# Patient Record
Sex: Male | Born: 1970 | Race: White | Hispanic: No | Marital: Married | Smoking: Never smoker
Health system: Southern US, Community
[De-identification: ages and names within clinical notes are randomized; demographics above are authoritative.]

## PROBLEM LIST (undated history)

## (undated) DIAGNOSIS — R112 Nausea with vomiting, unspecified: Secondary | ICD-10-CM

## (undated) DIAGNOSIS — Z973 Presence of spectacles and contact lenses: Secondary | ICD-10-CM

## (undated) DIAGNOSIS — R059 Cough, unspecified: Secondary | ICD-10-CM

## (undated) DIAGNOSIS — N200 Calculus of kidney: Secondary | ICD-10-CM

## (undated) DIAGNOSIS — M545 Low back pain, unspecified: Secondary | ICD-10-CM

## (undated) DIAGNOSIS — J069 Acute upper respiratory infection, unspecified: Secondary | ICD-10-CM

## (undated) DIAGNOSIS — Z9889 Other specified postprocedural states: Secondary | ICD-10-CM

## (undated) DIAGNOSIS — R05 Cough: Secondary | ICD-10-CM

## (undated) HISTORY — DX: Cough: R05

## (undated) HISTORY — PX: RETINAL DETACHMENT SURGERY: SHX105

## (undated) HISTORY — DX: Acute upper respiratory infection, unspecified: J06.9

## (undated) HISTORY — DX: Calculus of kidney: N20.0

## (undated) HISTORY — PX: NASAL SINUS SURGERY: SHX719

## (undated) HISTORY — DX: Low back pain: M54.5

## (undated) HISTORY — DX: Low back pain, unspecified: M54.50

## (undated) HISTORY — DX: Cough, unspecified: R05.9

## (undated) HISTORY — PX: SHOULDER SURGERY: SHX246

## (undated) HISTORY — PX: FACIAL RECONSTRUCTION SURGERY: SHX631

---

## 2002-11-27 ENCOUNTER — Ambulatory Visit (HOSPITAL_COMMUNITY): Admission: RE | Admit: 2002-11-27 | Discharge: 2002-11-27 | Payer: Self-pay | Admitting: Orthopedic Surgery

## 2002-11-27 ENCOUNTER — Encounter: Payer: Self-pay | Admitting: Orthopedic Surgery

## 2010-10-13 ENCOUNTER — Encounter: Payer: Self-pay | Admitting: Critical Care Medicine

## 2010-10-13 ENCOUNTER — Ambulatory Visit (INDEPENDENT_AMBULATORY_CARE_PROVIDER_SITE_OTHER)
Admission: RE | Admit: 2010-10-13 | Discharge: 2010-10-13 | Disposition: A | Discharge status: Home / Self Care | Payer: PRIVATE HEALTH INSURANCE | Source: Ambulatory Visit | Attending: Critical Care Medicine | Admitting: Critical Care Medicine

## 2010-10-13 ENCOUNTER — Ambulatory Visit (INDEPENDENT_AMBULATORY_CARE_PROVIDER_SITE_OTHER): Payer: PRIVATE HEALTH INSURANCE | Admitting: Critical Care Medicine

## 2010-10-13 DIAGNOSIS — R05 Cough: Secondary | ICD-10-CM

## 2010-10-13 DIAGNOSIS — J309 Allergic rhinitis, unspecified: Secondary | ICD-10-CM

## 2010-10-13 DIAGNOSIS — R059 Cough, unspecified: Secondary | ICD-10-CM | POA: Insufficient documentation

## 2010-10-13 MED ORDER — BENZONATATE 100 MG PO CAPS: ORAL_CAPSULE | ORAL | Status: AC

## 2010-10-13 MED ORDER — PREDNISONE 10 MG PO TABS: ORAL_TABLET | ORAL | Status: DC

## 2010-10-13 MED ORDER — CHLORPHENIRAMINE MALEATE CR 8 MG PO CPCR: 8.0000 mg | ORAL_CAPSULE | Freq: Two times a day (BID) | ORAL | Status: AC

## 2010-10-13 MED ORDER — HYDROCOD POLST-CPM POLST ER 10-8 MG PO CP12: 1.0000 | ORAL_CAPSULE | Freq: Two times a day (BID) | ORAL | Status: DC | PRN

## 2010-10-13 MED ORDER — BECLOMETHASONE DIPROPIONATE 80 MCG/ACT NA AERS: 2.0000 | INHALATION_SPRAY | Freq: Every day | NASAL | Status: DC

## 2010-10-13 NOTE — Patient Instructions (Addendum)
Cyclic cough protocol Prednisone for 8 days Qnasl/chlorpheniramine  Chest xray today Reflux diet Return 3 weeks for recheck

## 2010-10-13 NOTE — Progress Notes (Signed)
Subjective:    Patient ID: Troy Solomon, male    DOB: 06/11/1970, 39 y.o.   MRN: [id number]  Cough This is a new problem. The current episode started more than 1 month ago. The problem has been unchanged. The cough is non-productive (sensation of choking, throat not sore,). Associated symptoms include chest pain, headaches, shortness of breath and wheezing. Pertinent negatives include no chills, ear pain, fever, heartburn, hemoptysis, myalgias, nasal congestion, postnasal drip, rash, rhinorrhea or sore throat. The symptoms are aggravated by exercise and lying down (humidity). 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 He has tried a beta-agonist inhaler and steroid inhaler for the symptoms. The treatment provided no relief. His past medical history is significant for bronchitis. There is no history of asthma, COPD, emphysema, environmental allergies or pneumonia.   40 y.o.WM dry cough for two months   Past Medical History  Diagnosis Date  . Cough   . Gout   . Lumbago   . URI (upper respiratory infection)      Family History  Problem Relation Age of Onset  . Dementia Maternal Grandfather   . Gout Father   . Heart failure Paternal Grandfather   . Heart disease Mother   . Allergies Mother   . Colon cancer Maternal Grandfather      History   Social History  . Marital Status: Married    Spouse Name: N/A    Number of Children: 3  . Years of Education: N/A   Occupational History  . Manager of [employer]    Social History Main Topics  . Smoking status: Never Smoker   . Smokeless tobacco: Never Used  . Alcohol Use: No  . Drug Use: No  . Sexually Active: Not on file   Other Topics Concern  . Not on file   Social History Narrative  . No narrative on file     No Known Allergies   Outpatient Prescriptions Prior to Visit  Medication Sig Dispense Refill  . allopurinol (ZYLOPRIM) 100 MG tablet Take 100 mg by mouth daily.        . budesonide-formoterol (SYMBICORT) 80-4.5 MCG/ACT inhaler Inhale 2 puffs into the lungs 2 (two) times daily.           Review of Systems  Constitutional: Negative for fever, chills, diaphoresis, activity change, appetite change, fatigue and unexpected weight change.  HENT: Positive for congestion and sneezing. Negative for hearing loss, ear pain, nosebleeds, sore throat, facial swelling, rhinorrhea, mouth sores, trouble swallowing, neck pain, neck stiffness, dental problem, voice change, postnasal drip, sinus pressure, tinnitus and ear discharge.   Eyes: Negative for photophobia, discharge, itching and visual disturbance.  Respiratory: Positive for cough, choking, shortness of breath and wheezing. Negative for apnea, hemoptysis, chest tightness and stridor.   Cardiovascular: Positive for chest pain. Negative for palpitations and leg swelling.  Gastrointestinal: Negative for heartburn, nausea, vomiting, abdominal pain, constipation, blood in stool and abdominal distention.  Genitourinary: Negative for dysuria, urgency, frequency, hematuria, flank pain, decreased urine volume and difficulty urinating.  Musculoskeletal: Positive for back pain. Negative for myalgias, joint swelling, arthralgias and gait problem.  Skin: Negative for color change, pallor and rash.  Neurological: Positive for light-headedness and headaches. Negative for dizziness, tremors, seizures, syncope, speech difficulty, weakness and numbness.  Hematological: Negative for environmental allergies and adenopathy. Does not bruise/bleed easily.  Psychiatric/Behavioral: Negative for confusion, sleep disturbance and agitation. The patient is not nervous/anxious.        Objective:  Physical Exam  Filed Vitals:   10/13/10 1522  BP: 124/80  Pulse: 95  Temp: 98.3 F (36.8 C)  TempSrc: Oral  Height: 5\' 10"  (1.778 m)  Weight: 234 lb 12.8 oz (106.505 kg)  SpO2: 98%    Gen: Pleasant, well-nourished, in no distress,  normal  affect  ENT:   mouth clear,  oropharynx clear, +  postnasal drip. Moderate turbinate edema, mild purulence R nares  Neck: No JVD, no TMG, no carotid bruits  Lungs: No use of accessory muscles, no dullness to percussion, clear without rales or rhonchi  Cardiovascular: RRR, heart sounds normal, no murmur or gallops, no peripheral edema  Abdomen: soft and NT, no HSM,  BS normal  Musculoskeletal: No deformities, no cyanosis or clubbing  Neuro: alert, non focal  Skin: Warm, no lesions or rashes  10/13/10 spiro: Normal  8/22 CXR NAD     Assessment & Plan:   Cough Cyclical cough d/t postnasal drip syndrome d/t allergic rhinitis, mild sinusitis No primary lung dz Normal Spirometry 8/12 Normal CXR 8/12  Plan Cyclic cough protocol Prednisone for 8 days Qnasl/chlorpheniramine  Reflux diet Return 3 weeks for recheck      Updated Medication List Outpatient Encounter Prescriptions as of 10/13/2010  Medication Sig Dispense Refill  . allopurinol (ZYLOPRIM) 300 MG tablet Take 300 mg by mouth daily.        . indomethacin (INDOCIN SR) 75 MG CR capsule Take 75 mg by mouth as needed.        Marland Kitchen DISCONTD: allopurinol (ZYLOPRIM) 100 MG tablet Take 100 mg by mouth daily.       . Beclomethasone Dipropionate (QNASL) 80 MCG/ACT AERS Place 2 puffs into the nose daily.  1 Inhaler  6  . benzonatate (TESSALON) 100 MG capsule Take per cough protocol  90 capsule  4  . chlorpheniramine (CHLOR-TRIMETON) 8 MG capsule Take 1 capsule (8 mg total) by mouth 2 (two) times daily.  60 capsule  6  . Hydrocod Polst-Chlorphen Polst (TUSSICAPS) 10-8 MG CP12 Take 1 capsule by mouth 2 (two) times daily as needed.  6 each  0  . predniSONE (DELTASONE) 10 MG tablet Take 4 for two days, three for two days, two for two days, then one for two days and stop   20 tablet  0  . DISCONTD: budesonide-formoterol (SYMBICORT) 80-4.5 MCG/ACT inhaler Inhale 2 puffs into the lungs 2 (two) times daily.

## 2010-10-14 NOTE — Assessment & Plan Note (Addendum)
Cyclical cough d/t postnasal drip syndrome d/t allergic rhinitis, mild sinusitis No primary lung dz Normal Spirometry 8/12 Normal CXR 8/12  Plan Cyclic cough protocol Prednisone for 8 days Qnasl/chlorpheniramine  Reflux diet Return 3 weeks for recheck

## 2010-10-18 ENCOUNTER — Telehealth: Payer: Self-pay | Admitting: Critical Care Medicine

## 2010-10-18 NOTE — Telephone Encounter (Signed)
Call pt and tell him CXR was normal Advised wife xray was normal and nothing further was needed

## 2010-11-05 ENCOUNTER — Ambulatory Visit: Payer: PRIVATE HEALTH INSURANCE | Admitting: Critical Care Medicine

## 2010-11-12 ENCOUNTER — Ambulatory Visit: Payer: PRIVATE HEALTH INSURANCE | Admitting: Critical Care Medicine

## 2014-05-07 ENCOUNTER — Ambulatory Visit: Payer: Self-pay | Admitting: Physician Assistant

## 2014-07-15 ENCOUNTER — Ambulatory Visit
Admission: EM | Admit: 2014-07-15 | Discharge: 2014-07-15 | Disposition: A | Discharge status: Home / Self Care | Payer: 59 | Attending: Family Medicine | Admitting: Family Medicine

## 2014-07-15 DIAGNOSIS — M109 Gout, unspecified: Secondary | ICD-10-CM

## 2014-07-15 DIAGNOSIS — M1000 Idiopathic gout, unspecified site: Secondary | ICD-10-CM | POA: Diagnosis not present

## 2014-07-15 MED ORDER — INDOMETHACIN 50 MG PO CAPS: 50.0000 mg | ORAL_CAPSULE | Freq: Three times a day (TID) | ORAL | Status: DC | PRN

## 2014-07-15 NOTE — ED Provider Notes (Signed)
CSN: [id number]     Arrival date & time 07/15/14  1318 History   First MD Initiated Contact with Patient 07/15/14 1437     Chief Complaint  Patient presents with  . Gout   (Consider location/radiation/quality/duration/timing/severity/associated sxs/prior Treatment) HPI      44 year old male presents complaining of a gout flareup. He has pain, redness, and swelling in his right first MTP since yesterday. He has had gout many times in this exact spot. Denies any injury. Denies any systemic symptoms. This has been treated in the past with indomethacin, he is requesting a new prescription for indomethacin  Past Medical History  Diagnosis Date  . Cough   . Gout   . Lumbago   . URI (upper respiratory infection)    Past Surgical History  Procedure Laterality Date  . Facial reconstruction surgery    . Shoulder surgery     Family History  Problem Relation Age of Onset  . Dementia Maternal Grandfather   . Gout Father   . Heart failure Paternal Grandfather   . Heart disease Mother   . Allergies Mother   . Colon cancer Maternal Grandfather    History  Substance Use Topics  . Smoking status: Never Smoker   . Smokeless tobacco: Never Used  . Alcohol Use: No    Review of Systems  Musculoskeletal: Positive for joint swelling and arthralgias.  All other systems reviewed and are negative.   Allergies  Review of patient's allergies indicates no known allergies.  Home Medications   Prior to Admission medications   Medication Sig Start Date End Date Taking? Authorizing Provider  allopurinol (ZYLOPRIM) 300 MG tablet Take 300 mg by mouth daily.      Historical Provider, MD  Beclomethasone Dipropionate (QNASL) 80 MCG/ACT AERS Place 2 puffs into the nose daily. 10/13/10   Elsie Stain, MD  Hydrocod Polst-Chlorphen Polst (TUSSICAPS) 10-8 MG CP12 Take 1 capsule by mouth 2 (two) times daily as needed. 10/13/10   Elsie Stain, MD  indomethacin (INDOCIN SR) 75 MG CR capsule Take 75 mg by  mouth as needed.      Historical Provider, MD  indomethacin (INDOCIN) 50 MG capsule Take 1 capsule (50 mg total) by mouth 3 (three) times daily as needed for mild pain. 07/15/14   Liam Graham, PA-C  predniSONE (DELTASONE) 10 MG tablet Take 4 for two days, three for two days, two for two days, then one for two days and stop  10/13/10   Elsie Stain, MD   BP 140/84 mmHg  Pulse 71  Temp(Src) 98 F (36.7 C) (Oral)  Resp 16  Ht 5\' 10"  (1.778 m)  Wt 208 lb (94.348 kg)  BMI 29.84 kg/m2  SpO2 100% Physical Exam  Constitutional: He is oriented to person, place, and time. He appears well-developed and well-nourished. No distress.  HENT:  Head: Normocephalic.  Pulmonary/Chest: Effort normal. No respiratory distress.  Musculoskeletal:       Right foot: There is decreased range of motion, tenderness and swelling. There is no deformity.  Pain redness swelling and decreased RM of the right first MTP  Neurological: He is alert and oriented to person, place, and time. Coordination normal.  Skin: Skin is warm and dry. No rash noted. He is not diaphoretic.  Psychiatric: He has a normal mood and affect. Judgment normal.  Nursing note and vitals reviewed.   ED Course  Procedures (including critical care time) Labs Review Labs Reviewed - No data to display  Imaging Review No results found.   MDM   1. Podagra    Consistent with gout. Treat with indomethacin. I have encouraged him follow-up with his primary care provider in a month to consider starting on allopurinol.    Liam Graham, PA-C 07/15/14 1447

## 2014-07-15 NOTE — ED Notes (Signed)
Right great toe pain and swelling X 1 day. Has a history of gout. Indomethacin usually resolves his issue, but he did not have any tablets left.

## 2014-07-15 NOTE — Discharge Instructions (Signed)
Gout Gout is an inflammatory arthritis caused by a buildup of uric acid crystals in the joints. Uric acid is a chemical that is normally present in the blood. When the level of uric acid in the blood is too high it can form crystals that deposit in your joints and tissues. This causes joint redness, soreness, and swelling (inflammation). Repeat attacks are common. Over time, uric acid crystals can form into masses (tophi) near a joint, destroying bone and causing disfigurement. Gout is treatable and often preventable. CAUSES  The disease begins with elevated levels of uric acid in the blood. Uric acid is produced by your body when it breaks down a naturally found substance called purines. Certain foods you eat, such as meats and fish, contain high amounts of purines. Causes of an elevated uric acid level include:  Being passed down from parent to child (heredity).  Diseases that cause increased uric acid production (such as obesity, psoriasis, and certain cancers).  Excessive alcohol use.  Diet, especially diets rich in meat and seafood.  Medicines, including certain cancer-fighting medicines (chemotherapy), water pills (diuretics), and aspirin.  Chronic kidney disease. The kidneys are no longer able to remove uric acid well.  Problems with metabolism. Conditions strongly associated with gout include:  Obesity.  High blood pressure.  High cholesterol.  Diabetes. Not everyone with elevated uric acid levels gets gout. It is not understood why some people get gout and others do not. Surgery, joint injury, and eating too much of certain foods are some of the factors that can lead to gout attacks. SYMPTOMS   An attack of gout comes on quickly. It causes intense pain with redness, swelling, and warmth in a joint.  Fever can occur.  Often, only one joint is involved. Certain joints are more commonly involved:  Base of the big toe.  Knee.  Ankle.  Wrist.  Finger. Without  treatment, an attack usually goes away in a few days to weeks. Between attacks, you usually will not have symptoms, which is different from many other forms of arthritis. DIAGNOSIS  Your caregiver will suspect gout based on your symptoms and exam. In some cases, tests may be recommended. The tests may include:  Blood tests.  Urine tests.  X-rays.  Joint fluid exam. This exam requires a needle to remove fluid from the joint (arthrocentesis). Using a microscope, gout is confirmed when uric acid crystals are seen in the joint fluid. TREATMENT  There are two phases to gout treatment: treating the sudden onset (acute) attack and preventing attacks (prophylaxis).  Treatment of an Acute Attack.  Medicines are used. These include anti-inflammatory medicines or steroid medicines.  An injection of steroid medicine into the affected joint is sometimes necessary.  The painful joint is rested. Movement can worsen the arthritis.  You may use warm or cold treatments on painful joints, depending which works best for you.  Treatment to Prevent Attacks.  If you suffer from frequent gout attacks, your caregiver may advise preventive medicine. These medicines are started after the acute attack subsides. These medicines either help your kidneys eliminate uric acid from your body or decrease your uric acid production. You may need to stay on these medicines for a very long time.  The early phase of treatment with preventive medicine can be associated with an increase in acute gout attacks. For this reason, during the first few months of treatment, your caregiver may also advise you to take medicines usually used for acute gout treatment. Be sure you   understand your caregiver's directions. Your caregiver may make several adjustments to your medicine dose before these medicines are effective.  Discuss dietary treatment with your caregiver or dietitian. Alcohol and drinks high in sugar and fructose and foods  such as meat, poultry, and seafood can increase uric acid levels. Your caregiver or dietitian can advise you on drinks and foods that should be limited. HOME CARE INSTRUCTIONS   Do not take aspirin to relieve pain. This raises uric acid levels.  Only take over-the-counter or prescription medicines for pain, discomfort, or fever as directed by your caregiver.  Rest the joint as much as possible. When in bed, keep sheets and blankets off painful areas.  Keep the affected joint raised (elevated).  Apply warm or cold treatments to painful joints. Use of warm or cold treatments depends on which works best for you.  Use crutches if the painful joint is in your leg.  Drink enough fluids to keep your urine clear or pale yellow. This helps your body get rid of uric acid. Limit alcohol, sugary drinks, and fructose drinks.  Follow your dietary instructions. Pay careful attention to the amount of protein you eat. Your daily diet should emphasize fruits, vegetables, whole grains, and fat-free or low-fat milk products. Discuss the use of coffee, vitamin C, and cherries with your caregiver or dietitian. These may be helpful in lowering uric acid levels.  Maintain a healthy body weight. SEEK MEDICAL CARE IF:   You develop diarrhea, vomiting, or any side effects from medicines.  You do not feel better in 24 hours, or you are getting worse. SEEK IMMEDIATE MEDICAL CARE IF:   Your joint becomes suddenly more tender, and you have chills or a fever. MAKE SURE YOU:   Understand these instructions.  Will watch your condition.  Will get help right away if you are not doing well or get worse. Document Released: 02/05/2000 Document Revised: 06/24/2013 Document Reviewed: 09/21/2011 [employer] Patient Information 2015 [employer]. This information is not intended to replace advice given to you by your health care provider. Make sure you discuss any questions you have with your health care  provider. Low-Purine Diet Purines are compounds that affect the level of uric acid in your body. A low-purine diet is a diet that is low in purines. Eating a low-purine diet can prevent the level of uric acid in your body from getting too high and causing gout or kidney stones or both. WHAT DO I NEED TO KNOW ABOUT THIS DIET?  Choose low-purine foods. Examples of low-purine foods are listed in the next section.  Drink plenty of fluids, especially water. Fluids can help remove uric acid from your body. Try to drink 8-16 cups (1.9-3.8 L) a day.  Limit foods high in fat, especially saturated fat, as fat makes it harder for the body to get rid of uric acid. Foods high in saturated fat include pizza, cheese, ice cream, whole milk, fried foods, and gravies. Choose foods that are lower in fat and lean sources of protein. Use olive oil when cooking as it contains healthy fats that are not high in saturated fat.  Limit alcohol. Alcohol interferes with the elimination of uric acid from your body. If you are having a gout attack, avoid all alcohol.  Keep in mind that different people's bodies react differently to different foods. You will probably learn over time which foods do or do not affect you. If you discover that a food tends to cause your gout to flare up,   avoid eating that food. You can more freely enjoy foods that do not cause problems. If you have any questions about a food item, talk to your dietitian or health care provider. WHICH FOODS ARE LOW, MODERATE, AND HIGH IN PURINES? The following is a list of foods that are low, moderate, and high in purines. You can eat any amount of the foods that are low in purines. You may be able to have small amounts of foods that are moderate in purines. Ask your health care provider how much of a food moderate in purines you can have. Avoid foods high in purines. Grains  Foods low in purines: Enriched white bread, pasta, rice, cake, cornbread, popcorn.  Foods  moderate in purines: Whole-grain breads and cereals, wheat germ, bran, oatmeal. Uncooked oatmeal. Dry wheat bran or wheat germ.  Foods high in purines: Pancakes, French toast, biscuits, muffins. Vegetables  Foods low in purines: All vegetables, except those that are moderate in purines.  Foods moderate in purines: Asparagus, cauliflower, spinach, mushrooms, green peas. Fruits  All fruits are low in purines. Meats and other Protein Foods  Foods low in purines: Eggs, nuts, peanut butter.  Foods moderate in purines: 80-90% lean beef, lamb, veal, pork, poultry, fish, eggs, peanut butter, nuts. Crab, lobster, oysters, and shrimp. Cooked dried beans, peas, and lentils.  Foods high in purines: Anchovies, sardines, herring, mussels, tuna, codfish, scallops, trout, and haddock. Bacon. Organ meats (such as liver or kidney). Tripe. Game meat. Goose. Sweetbreads. Dairy  All dairy foods are low in purines. Low-fat and fat-free dairy products are best because they are low in saturated fat. Beverages  Drinks low in purines: Water, carbonated beverages, tea, coffee, cocoa.  Drinks moderate in purines: Soft drinks and other drinks sweetened with high-fructose corn syrup. Juices. To find whether a food or drink is sweetened with high-fructose corn syrup, look at the ingredients list.  Drinks high in purines: Alcoholic beverages (such as beer). Condiments  Foods low in purines: Salt, herbs, olives, pickles, relishes, vinegar.  Foods moderate in purines: Butter, margarine, oils, mayonnaise. Fats and Oils  Foods low in purines: All types, except gravies and sauces made with meat.  Foods high in purines: Gravies and sauces made with meat. Other Foods  Foods low in purines: Sugars, sweets, gelatin. Cake. Soups made without meat.  Foods moderate in purines: Meat-based or fish-based soups, broths, or bouillons. Foods and drinks sweetened with high-fructose corn syrup.  Foods high in purines:  High-fat desserts (such as ice cream, cookies, cakes, pies, doughnuts, and chocolate). Contact your dietitian for more information on foods that are not listed here. Document Released: 06/04/2010 Document Revised: 02/12/2013 Document Reviewed: 01/14/2013 [employer] Patient Information 2015 [employer]. This information is not intended to replace advice given to you by your health care provider. Make sure you discuss any questions you have with your health care provider.  

## 2014-09-07 ENCOUNTER — Ambulatory Visit
Admission: EM | Admit: 2014-09-07 | Discharge: 2014-09-07 | Disposition: A | Discharge status: Home / Self Care | Payer: 59 | Attending: Physician Assistant | Admitting: Physician Assistant

## 2014-09-07 ENCOUNTER — Encounter: Payer: Self-pay | Admitting: Physician Assistant

## 2014-09-07 DIAGNOSIS — M545 Low back pain, unspecified: Secondary | ICD-10-CM

## 2014-09-07 MED ORDER — IBUPROFEN 600 MG PO TABS: 600.0000 mg | ORAL_TABLET | Freq: Four times a day (QID) | ORAL | Status: DC | PRN

## 2014-09-07 NOTE — ED Notes (Signed)
Moving patio stones yesterday, pain in center of lower back started yesterday, progressively worsened today.  Difficulty standing once sitting for awhile.  No change in bowel or bladder.

## 2014-09-07 NOTE — Discharge Instructions (Signed)
Ice paks/anti-inflammatory Rx as discussed   Ibuprofen 600mg  plus 200 mg = 800mg     three tiimes daily with meals x 3-5 days Step down dosage  as tolerated 600     400      Use heat and ice paks as comfortable as time progresses Back exercises  Body mechanics.. Ears-shoulders-hips-ankles !     Back Pain, Adult Low back pain is very common. About 1 in 5 people have back pain.The cause of low back pain is rarely dangerous. The pain often gets better over time.About half of people with a sudden onset of back pain feel better in just 2 weeks. About 8 in 10 people feel better by 6 weeks.  CAUSES Some common causes of back pain include:  Strain of the muscles or ligaments supporting the spine.  Wear and tear (degeneration) of the spinal discs.  Arthritis.  Direct injury to the back. DIAGNOSIS Most of the time, the direct cause of low back pain is not known.However, back pain can be treated effectively even when the exact cause of the pain is unknown.Answering your caregiver's questions about your overall health and symptoms is one of the most accurate ways to make sure the cause of your pain is not dangerous. If your caregiver needs more information, he or she may order lab work or imaging tests (X-rays or MRIs).However, even if imaging tests show changes in your back, this usually does not require surgery. HOME CARE INSTRUCTIONS For many people, back pain returns.Since low back pain is rarely dangerous, it is often a condition that people can learn to [facility] their own.   Remain active. It is stressful on the back to sit or stand in one place. Do not sit, drive, or stand in one place for more than 30 minutes at a time. Take short walks on level surfaces as soon as pain allows.Try to increase the length of time you walk each day.  Do not stay in bed.Resting more than 1 or 2 days can delay your recovery.  Do not avoid exercise or work.Your body is made to move.It is not  dangerous to be active, even though your back may hurt.Your back will likely heal faster if you return to being active before your pain is gone.  Pay attention to your body when you bend and lift. Many people have less discomfortwhen lifting if they bend their knees, keep the load close to their bodies,and avoid twisting. Often, the most comfortable positions are those that put less stress on your recovering back.  Find a comfortable position to sleep. Use a firm mattress and lie on your side with your knees slightly bent. If you lie on your back, put a pillow under your knees.  Only take over-the-counter or prescription medicines as directed by your caregiver. Over-the-counter medicines to reduce pain and inflammation are often the most helpful.Your caregiver may prescribe muscle relaxant drugs.These medicines help dull your pain so you can more quickly return to your normal activities and healthy exercise.  Put ice on the injured area.  Put ice in a plastic bag.  Place a towel between your skin and the bag.  Leave the ice on for 15-20 minutes, 03-04 times a day for the first 2 to 3 days. After that, ice and heat may be alternated to reduce pain and spasms.  Ask your caregiver about trying back exercises and gentle massage. This may be of some benefit.  Avoid feeling anxious or stressed.Stress increases muscle tension and can  worsen back pain.It is important to recognize when you are anxious or stressed and learn ways to manage it.Exercise is a great option. SEEK MEDICAL CARE IF:  You have pain that is not relieved with rest or medicine.  You have pain that does not improve in 1 week.  You have new symptoms.  You are generally not feeling well. SEEK IMMEDIATE MEDICAL CARE IF:   You have pain that radiates from your back into your legs.  You develop new bowel or bladder control problems.  You have unusual weakness or numbness in your arms or legs.  You develop nausea or  vomiting.  You develop abdominal pain.  You feel faint. Document Released: 02/07/2005 Document Revised: 08/09/2011 Document Reviewed: 06/11/2013 [facility] Patient Information 2015 [place], [place]. This information is not intended to replace advice given to you by your health care provider. Make sure you discuss any questions you have with your health care provider.

## 2014-09-07 NOTE — ED Provider Notes (Signed)
CSN: [id number]     Arrival date & time 09/07/14  1530 History   None    Chief Complaint  Patient presents with  . Back Pain   (Consider location/radiation/quality/duration/timing/severity/associated sxs/prior Treatment) HPI 44 yo M moved [employer] for house party yesterday.Low back discomfort today. Took ibuprofen 800 mg x 1 yesterday and x 1 today with some relief.Feels uncomfortable when changing position from sit/stand/sit.  No numbness or tingling, no difficulty voiding or stopping stream. Has been in [place] shopping at [employer] all afternoon. Drove family over, walked the warehouse Wearing flip flops. Works with [employer], sometimes meetings, sometimes driving trucks  Past Medical History  Diagnosis Date  . Cough   . Gout   . Lumbago   . URI (upper respiratory infection)    Past Surgical History  Procedure Laterality Date  . Facial reconstruction surgery    . Shoulder surgery     Family History  Problem Relation Age of Onset  . Dementia Maternal Grandfather   . Gout Father   . Heart failure Paternal Grandfather   . Heart disease Mother   . Allergies Mother   . Colon cancer Maternal Grandfather    History  Substance Use Topics  . Smoking status: Never Smoker   . Smokeless tobacco: Never Used  . Alcohol Use: No    Review of Systems Review of 10 systems negative for acute change except as referenced in HPI Allergies  Review of patient's allergies indicates no known allergies.  Home Medications   Prior to Admission medications   Medication Sig Start Date End Date Taking? Authorizing Provider  allopurinol (ZYLOPRIM) 300 MG tablet Take 300 mg by mouth daily.      Historical Provider, MD  Beclomethasone Dipropionate (QNASL) 80 MCG/ACT AERS Place 2 puffs into the nose daily. 10/13/10   Elsie Stain, MD  Hydrocod Polst-Chlorphen Polst (TUSSICAPS) 10-8 MG CP12 Take 1 capsule by mouth 2 (two) times daily as needed. 10/13/10    Elsie Stain, MD  ibuprofen (ADVIL,MOTRIN) 600 MG tablet Take 1 tablet (600 mg total) by mouth every 6 (six) hours as needed for moderate pain. 09/07/14   Jan Fireman, PA-C  indomethacin (INDOCIN SR) 75 MG CR capsule Take 75 mg by mouth as needed.      Historical Provider, MD  indomethacin (INDOCIN) 50 MG capsule Take 1 capsule (50 mg total) by mouth 3 (three) times daily as needed for mild pain. 07/15/14   Liam Graham, PA-C  predniSONE (DELTASONE) 10 MG tablet Take 4 for two days, three for two days, two for two days, then one for two days and stop  10/13/10   Elsie Stain, MD  Denies use of any of the 2012 medications at this time  BP 132/70 mmHg  Pulse 66  Temp(Src) 97.8 F (36.6 C) (Oral)  Resp 20  Ht 5\' 10"  (1.778 m)  Wt 189 lb (85.73 kg)  BMI 27.12 kg/m2  SpO2 100% Physical Exam   Constitutional -alert and oriented,well appearing and appears in no acute distress, reporting low back pain since over-activity yesterday Head-atraumatic, normocephalic Eyes- conjunctiva normal, EOMI ,conjugate gaze Nose- no congestion or rhinorrhea Mouth/throat- mucous membranes moist , Neck- supple without glandular enlargement CV- regular rate, grossly normal heart sounds,  Resp-no distress, normal respiratory effort,clear to auscultation bilaterally Back - no CVAT , no paraspinous muscle tenderness, spinal column non-tender,  GI- no distention GU-  not examined MSK- ambulatory without assistance. Can climb  on and off table without assistance, toe walk , heel walk , toe touch, pinwheel, squat and return without assistance or expression of pain. Supine for SLR is neg bilaterally; DTRs equal bilat WNL; sensation normal bilat LEs; denies issues with void/defecate Neuro- normal speech and language, no gross focal neurological deficit appreciated, no gait instability, Skin-warm,dry ,intact; no rash noted Psych-mood and affect grossly normal; speech and behavior grossly normal ED Course   Procedures (including critical care time) Labs Review Labs Reviewed - No data to display  Imaging Review No results found.   MDM   1. Right-sided low back pain without sciatica   Plan: 1.  diagnosis reviewed with patient-low back strain 2. Rx as per orders; risks, benefits, potential side effects reviewed with patient- hx of GERD with NSAIDs, needs to be aware and step away if GI upset develops -go to tylenol 3. Recommend supportive treatment with ice packs/heat pad. His symptoms appear to be quite mild Encourage athletic rubs, given exercises,body mechanics reviewed, report back with exacerbation. 4. F/u prn if symptoms worsen or don't improve   Does not want time off tomorrow  Jan Fireman, PA-C 09/07/14 1700

## 2014-09-09 ENCOUNTER — Telehealth: Payer: Self-pay | Admitting: Physician Assistant

## 2014-09-09 NOTE — ED Notes (Signed)
Will notify our attending physician.

## 2015-06-03 ENCOUNTER — Ambulatory Visit
Admission: EM | Admit: 2015-06-03 | Discharge: 2015-06-03 | Disposition: A | Discharge status: Home / Self Care | Payer: 59 | Attending: Family Medicine | Admitting: Family Medicine

## 2015-06-03 ENCOUNTER — Encounter: Payer: Self-pay | Admitting: *Deleted

## 2015-06-03 DIAGNOSIS — H01001 Unspecified blepharitis right upper eyelid: Secondary | ICD-10-CM

## 2015-06-03 MED ORDER — CEFUROXIME AXETIL 500 MG PO TABS: 500.0000 mg | ORAL_TABLET | Freq: Two times a day (BID) | ORAL | Status: DC

## 2015-06-03 NOTE — Discharge Instructions (Signed)
Blepharitis Blepharitis means swollen eyelids. HOME CARE Pay attention to any changes in how you look or feel. Follow these instructions to help with your condition: Keeping Clean  Wash your hands often.  Wash your eyelids with warm water, or wash them with warm water that is mixed with little bit of baby shampoo. Do this 2 or more times per day.  Wash your face and eyebrows at least once a day.  Use a clean towel each time you dry your eyelids. Do not use the towel to clean or dry other areas of your body. Do not share your towel with anyone. General Instructions  Avoid wearing makeup until you get better. Do not share makeup with anyone.  Avoid rubbing your eyes.  Put a warm compress on your eyes 2 times per day for 10 minutes at a time or as told by your doctor.  If you were told to use an medicated cream or eye drops, use the medicine as told by your doctor. Do not stop using the medicine even if you feel better.  Keep all follow-up visits as told by your doctor. This is important. GET HELP IF:  Your eyelids feel hot.  You have blisters on your eyelids.  You have a rash on your eyelids.  The swelling does not go away in 2-4 days.  The swelling gets worse. GET HELP RIGHT AWAY IF:  You have pain that gets worse.  You have pain that spreads to other parts of your face.  You have redness that gets worse.  You have redness that spreads to other parts of your face.  Your vision changes.  You have pain when you look at lights or things that move.  You have a fever.   This information is not intended to replace advice given to you by your health care provider. Make sure you discuss any questions you have with your health care provider.   Document Released: 11/17/2007 Document Revised: 10/29/2014 Document Reviewed: 06/02/2014 Elsevier Interactive Patient Education [employer].

## 2015-06-03 NOTE — ED Provider Notes (Signed)
CSN: [id number]     Arrival date & time 06/03/15  1031 History   First MD Initiated Contact with Patient 06/03/15 1051    Nurses notes were reviewed. Chief Complaint  Patient presents with  . Belepharitis   Patient's here because of infection of the right eyelid. He states his wife dose last night he startedwith irritation and itching of the right eye. Denies medical problems states is pretty healthy no significant family medical history pertinent to this visit and he's never smoked. Reports irritation the right eye now and since last Dierdre Highman he is going to the beach this afternoon and came in to be evaluated and seen.    (Consider location/radiation/quality/duration/timing/severity/associated sxs/prior Treatment) Patient is a 45 y.o. male presenting with eye problem. The history is provided by the patient. No language interpreter was used.  Eye Problem Location:  R eye Quality:  Aching Severity:  Moderate Onset quality:  Sudden Duration:  2 days Timing:  Constant Progression:  Worsening Chronicity:  New Context: not burn, not chemical exposure, not direct trauma, not foreign body and not scratch   Relieved by:  Nothing Worsened by:  Nothing tried Ineffective treatments:  None tried Associated symptoms: redness and swelling   Risk factors: no conjunctival hemorrhage, not exposed to pinkeye, no previous injury to eye, no recent herpes zoster and no recent URI     Past Medical History  Diagnosis Date  . Cough   . Gout   . Lumbago   . URI (upper respiratory infection)    Past Surgical History  Procedure Laterality Date  . Facial reconstruction surgery    . Shoulder surgery     Family History  Problem Relation Age of Onset  . Dementia Maternal Grandfather   . Gout Father   . Heart failure Paternal Grandfather   . Heart disease Mother   . Allergies Mother   . Colon cancer Maternal Grandfather    Social History  Substance Use Topics  . Smoking status: Never Smoker     . Smokeless tobacco: Never Used  . Alcohol Use: No    Review of Systems  Eyes: Positive for redness.  All other systems reviewed and are negative.   Allergies  Review of patient's allergies indicates no known allergies.  Home Medications   Prior to Admission medications   Medication Sig Start Date End Date Taking? Authorizing Provider  ibuprofen (ADVIL,MOTRIN) 600 MG tablet Take 1 tablet (600 mg total) by mouth every 6 (six) hours as needed for moderate pain. 09/07/14  Yes Jan Fireman, PA-C  allopurinol (ZYLOPRIM) 300 MG tablet Take 300 mg by mouth daily.      Historical Provider, MD  Beclomethasone Dipropionate (QNASL) 80 MCG/ACT AERS Place 2 puffs into the nose daily. 10/13/10   Elsie Stain, MD  cefUROXime (CEFTIN) 500 MG tablet Take 1 tablet (500 mg total) by mouth 2 (two) times daily. 06/03/15   Frederich Cha, MD  Hydrocod Polst-Chlorphen Polst (TUSSICAPS) 10-8 MG CP12 Take 1 capsule by mouth 2 (two) times daily as needed. 10/13/10   Elsie Stain, MD  indomethacin (INDOCIN SR) 75 MG CR capsule Take 75 mg by mouth as needed.      Historical Provider, MD  indomethacin (INDOCIN) 50 MG capsule Take 1 capsule (50 mg total) by mouth 3 (three) times daily as needed for mild pain. 07/15/14   Freeman Caldron Baker, PA-C  predniSONE (DELTASONE) 10 MG tablet Take 4 for two days, three for two days, two for  two days, then one for two days and stop  10/13/10   Elsie Stain, MD   Meds Ordered and Administered this Visit  Medications - No data to display  BP 121/82 mmHg  Pulse 75  Temp(Src) 98.3 F (36.8 C) (Oral)  Resp 18  Ht 5\' 10"  (1.778 m)  Wt 205 lb (92.987 kg)  BMI 29.41 kg/m2  SpO2 99% No data found.   Physical Exam  Constitutional: He is oriented to person, place, and time. He appears well-developed.  HENT:  Head: Normocephalic and atraumatic.  Eyes: Conjunctivae are normal. Pupils are equal, round, and reactive to light. Right eye exhibits hordeolum.    Neck: Normal  range of motion.  Musculoskeletal: Normal range of motion.  Neurological: He is alert and oriented to person, place, and time.  Skin: Skin is warm and dry.  Psychiatric: He has a normal mood and affect.  Vitals reviewed.   ED Course  Procedures (including critical care time)  Labs Review Labs Reviewed - No data to display  Imaging Review No results found.   Visual Acuity Review  Right Eye Distance:   Left Eye Distance:   Bilateral Distance:    Right Eye Near:   Left Eye Near:    Bilateral Near:         MDM   1. Blepharitis of right upper eyelid    We'll place patient on Ceftin 500 mg twice a day. He take Claritin over-the-counter. He sitting to [facility] explained to him that if he continues to have trouble by Friday this being Wednesday he is to see an ophthalmologist of his choice for hopefully the Ceftin and warm compresses will make a difference.    Frederich Cha, MD 06/03/15 1140

## 2015-06-03 NOTE — ED Notes (Signed)
Patient started having symptoms of right upper eyelid swelling irritation last PM. No previous history of eye lid infections.

## 2016-03-27 ENCOUNTER — Ambulatory Visit (INDEPENDENT_AMBULATORY_CARE_PROVIDER_SITE_OTHER): Payer: 59

## 2016-03-27 ENCOUNTER — Ambulatory Visit
Admission: EM | Admit: 2016-03-27 | Discharge: 2016-03-27 | Disposition: A | Discharge status: Home / Self Care | Payer: 59 | Attending: Emergency Medicine | Admitting: Emergency Medicine

## 2016-03-27 DIAGNOSIS — S90122A Contusion of left lesser toe(s) without damage to nail, initial encounter: Secondary | ICD-10-CM | POA: Diagnosis not present

## 2016-03-27 NOTE — ED Triage Notes (Signed)
Patient complains of left pinky toe pain that he thinks he may have broken on the [place] yesterday [place] the beach. Toe is bruised and swollen with pain.

## 2016-03-27 NOTE — ED Provider Notes (Signed)
CSN: [id number]     Arrival date & time 03/27/16  1522 History   None    Chief Complaint  Patient presents with  . Toe Pain    left pinky toe   (Consider location/radiation/quality/duration/timing/severity/associated sxs/prior Treatment) HPI  This a 46 year old male who presents with fifth toe pain yesterday while he was going down a water slide.Possibly got stuck in a seam  Of the slide forcefully abducting his toe away from midline. First there was a small lump that occurred over the DIP area dorsally and has now resolved into a bruise. Had  pain with ambulation. History of gout ,states that this does not hurt as bad as gout but certainly is painful       Past Medical History:  Diagnosis Date  . Cough   . Gout   . Lumbago   . URI (upper respiratory infection)    Past Surgical History:  Procedure Laterality Date  . FACIAL RECONSTRUCTION SURGERY    . SHOULDER SURGERY     Family History  Problem Relation Age of Onset  . Dementia Maternal Grandfather   . Colon cancer Maternal Grandfather   . Gout Father   . Heart failure Paternal Grandfather   . Heart disease Mother   . Allergies Mother    Social History  Substance Use Topics  . Smoking status: Never Smoker  . Smokeless tobacco: Never Used  . Alcohol use No    Review of Systems  Constitutional: Positive for activity change. Negative for chills, fatigue and fever.  Musculoskeletal: Positive for arthralgias and joint swelling.  All other systems reviewed and are negative.   Allergies  Patient has no known allergies.  Home Medications   Prior to Admission medications   Not on File   Meds Ordered and Administered this Visit  Medications - No data to display  BP 136/72 (BP Location: Left Arm)   Pulse 71   Temp 97.8 F (36.6 C) (Oral)   Resp 16   Ht 5\' 10"  (1.778 m)   Wt 190 lb (86.2 kg)   SpO2 100%   BMI 27.26 kg/m  No data found.   Physical Exam  Constitutional: He appears well-developed and  well-nourished. No distress.  HENT:  Head: Normocephalic and atraumatic.  Eyes: EOM are normal. Pupils are equal, round, and reactive to light.  Neck: Normal range of motion. Neck supple.  Musculoskeletal:  Examination of the left foot today shows a ecchymosis at the base of the fifth toe proximal phalynx. He has no tenderness of the metatarsal. Some tenderness is over the DIP joint with abduction of the lateral stress. Is no tenderness over the proximal phalanx and much less so over the distal phalanx. Ankle range of motion and subtalar range of motion are unaffected. No other tenderness along the metatarsals or of the phalanges of 1 through 4  Skin: He is not diaphoretic.  Nursing note and vitals reviewed.   Urgent Care Course     Procedures (including critical care time)  Labs Review Labs Reviewed - No data to display  Imaging Review Dg Toe 5th Left  Result Date: 03/27/2016 CLINICAL DATA:  Blunt trauma on water slide yesterday with persistent pain, initial encounter EXAM: DG TOE 5TH LEFT COMPARISON:  None. FINDINGS: There is no evidence of fracture or dislocation. There is no evidence of arthropathy or other focal bone abnormality. Soft tissues are unremarkable. IMPRESSION: No acute abnormality noted. Electronically Signed   By: Inez Catalina M.D.   On:  03/27/2016 16:16     Visual Acuity Review  Right Eye Distance:   Left Eye Distance:   Bilateral Distance:    Right Eye Near:   Left Eye Near:    Bilateral Near:     Fifth toe was buddy taped to the fourth for support. June was encouraged uses a hard soled shoe for comfort.    MDM   1. Contusion of fifth toe of left foot, initial encounter    I have discussed  theinjury to the patient. I have also reviewed his x-ray. Recommended buddy taping for support and comfort. I've also advised him that a hard soled shoe people are comfortable for ambulation. 2 days he should elevate and ice as much as possible. Given him the name and  number of a podiatrist that he may follow-up with the is not progressing. 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 He may use Motrin or Tylenol for pain    Lorin Picket, PA-C 03/27/16 1652

## 2016-04-17 ENCOUNTER — Ambulatory Visit
Admission: EM | Admit: 2016-04-17 | Discharge: 2016-04-17 | Disposition: A | Discharge status: Home / Self Care | Payer: 59 | Attending: Family Medicine | Admitting: Family Medicine

## 2016-04-17 ENCOUNTER — Encounter: Payer: Self-pay | Admitting: Emergency Medicine

## 2016-04-17 DIAGNOSIS — J069 Acute upper respiratory infection, unspecified: Secondary | ICD-10-CM | POA: Diagnosis not present

## 2016-04-17 MED ORDER — PREDNISONE 20 MG PO TABS: ORAL_TABLET | ORAL | 0 refills | Status: DC

## 2016-04-17 MED ORDER — HYDROCOD POLST-CPM POLST ER 10-8 MG/5ML PO SUER: 5.0000 mL | Freq: Two times a day (BID) | ORAL | 0 refills | Status: DC

## 2016-04-17 MED ORDER — AZITHROMYCIN 250 MG PO TABS: 250.0000 mg | ORAL_TABLET | Freq: Every day | ORAL | 0 refills | Status: DC

## 2016-04-17 NOTE — ED Triage Notes (Signed)
Productive cough with green and yellow phlegm for 5 days

## 2016-04-17 NOTE — ED Provider Notes (Signed)
CSN: [id number]     Arrival date & time 04/17/16  0802 History   First MD Initiated Contact with Patient 04/17/16 [phone]     Chief Complaint  Patient presents with  . Cough   (Consider location/radiation/quality/duration/timing/severity/associated sxs/prior Treatment) HPI  This a 46 year old male who presents with a productive cough of green and yellow phlegm production. He's had it for about 5 days. 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 His coworker was diagnosed with bronchitis and given an albuterol inhaler. She has had a fever of 100 1. 2 in the morning but is 98.9 at present time. He states he did not take any medications this morning just NyQuil last night to help sleep. He called telemed who prescribed  Tessalon Perles but these were an effective.      Past Medical History:  Diagnosis Date  . Cough   . Gout   . Lumbago   . URI (upper respiratory infection)    Past Surgical History:  Procedure Laterality Date  . FACIAL RECONSTRUCTION SURGERY    . SHOULDER SURGERY     Family History  Problem Relation Age of Onset  . Dementia Maternal Grandfather   . Colon cancer Maternal Grandfather   . Gout Father   . Heart failure Paternal Grandfather   . Heart disease Mother   . Allergies Mother    Social History  Substance Use Topics  . Smoking status: Never Smoker  . Smokeless tobacco: Never Used  . Alcohol use No    Review of Systems  Constitutional: Positive for fever. Negative for activity change and chills.  HENT: Negative for congestion.   Respiratory: Positive for cough. Negative for shortness of breath, wheezing and stridor.   All other systems reviewed and are negative.   Allergies  Patient has no known allergies.  Home Medications   Prior to Admission medications   Medication Sig Start Date End Date Taking? Authorizing Provider  azithromycin (ZITHROMAX) 250 MG tablet Take 1 tablet (250 mg total) by mouth daily.  Take first 2 tablets together, then 1 every day until finished. 04/17/16   Lorin Picket, PA-C  chlorpheniramine-HYDROcodone (TUSSIONEX PENNKINETIC ER) 10-8 MG/5ML SUER Take 5 mLs by mouth 2 (two) times daily. 04/17/16   Lorin Picket, PA-C  predniSONE (DELTASONE) 20 MG tablet Take 2 tablets (40 mg) daily by mouth 04/17/16   Lorin Picket, PA-C   Meds Ordered and Administered this Visit  Medications - No data to display  BP 114/69 (BP Location: Left Arm)   Pulse 94   Temp 98.9 F (37.2 C) (Oral)   Resp 18   Ht 5\' 10"  (1.778 m)   Wt 187 lb (84.8 kg)   SpO2 99%   BMI 26.83 kg/m  No data found.   Physical Exam  Constitutional: He is oriented to person, place, and time. He appears well-developed and well-nourished. No distress.  HENT:  Head: Normocephalic and atraumatic.  Right Ear: External ear normal.  Left Ear: External ear normal.  Nose: Nose normal.  Mouth/Throat: Oropharynx is clear and moist. No oropharyngeal exudate.  Eyes: EOM are normal. Pupils are equal, round, and reactive to light. Right eye exhibits no discharge. Left eye exhibits no discharge.  Neck: Normal range of motion. Neck supple.  Pulmonary/Chest: Effort normal and breath sounds normal. No respiratory distress. He has no wheezes. He has no rales.  Musculoskeletal: Normal range of motion.  Lymphadenopathy:    He  has no cervical adenopathy.  Neurological: He is alert and oriented to person, place, and time.  Skin: Skin is warm and dry. He is not diaphoretic.  Psychiatric: He has a normal mood and affect. His behavior is normal. Judgment and thought content normal.  Nursing note and vitals reviewed.   Urgent Care Course     Procedures (including critical care time)  Labs Review Labs Reviewed - No data to display  Imaging Review No results found.   Visual Acuity Review  Right Eye Distance:   Left Eye Distance:   Bilateral Distance:    Right Eye Near:   Left Eye Near:    Bilateral Near:          MDM   1. Upper respiratory tract infection, unspecified type    Discharge Medication List as of 04/17/2016  8:48 AM    START taking these medications   Details  azithromycin (ZITHROMAX) 250 MG tablet Take 1 tablet (250 mg total) by mouth daily. Take first 2 tablets together, then 1 every day until finished., Starting Sun 04/17/2016, Print    chlorpheniramine-HYDROcodone (TUSSIONEX PENNKINETIC ER) 10-8 MG/5ML SUER Take 5 mLs by mouth 2 (two) times daily., Starting Sun 04/17/2016, Print    predniSONE (DELTASONE) 20 MG tablet Take 2 tablets (40 mg) daily by mouth, Normal      Plan: 1. Test/x-ray results and diagnosis reviewed with patient 2. rx as per orders; risks, benefits, potential side effects reviewed with patient 3. Recommend supportive treatment with Rest and fluids. Try short course of prednisone for the coughing . This is most likely a viral illness not responding to antibiotics but to continue  the treatment and not have him returning to our office if he continues to have coughing have given him a prescription for Z-Pak and he will use in a week to 10 days he is not improved. Cautioned use of the Tussionex with activities requiring concentration or judgment. Should not drive while taking the medication. Not improving should follow-up with primary care 4. F/u prn if symptoms worsen or don't improve     Lorin Picket, PA-C 04/17/16 [phone]

## 2017-07-11 ENCOUNTER — Emergency Department: Payer: Managed Care, Other (non HMO)

## 2017-07-11 ENCOUNTER — Encounter: Payer: Self-pay | Admitting: Emergency Medicine

## 2017-07-11 ENCOUNTER — Emergency Department
Admission: EM | Admit: 2017-07-11 | Discharge: 2017-07-11 | Disposition: A | Discharge status: Home / Self Care | Payer: Managed Care, Other (non HMO) | Attending: Emergency Medicine | Admitting: Emergency Medicine

## 2017-07-11 DIAGNOSIS — M25511 Pain in right shoulder: Secondary | ICD-10-CM | POA: Diagnosis not present

## 2017-07-11 DIAGNOSIS — M25531 Pain in right wrist: Secondary | ICD-10-CM | POA: Insufficient documentation

## 2017-07-11 DIAGNOSIS — Y998 Other external cause status: Secondary | ICD-10-CM | POA: Diagnosis not present

## 2017-07-11 DIAGNOSIS — Y92410 Unspecified street and highway as the place of occurrence of the external cause: Secondary | ICD-10-CM | POA: Diagnosis not present

## 2017-07-11 DIAGNOSIS — T23171A Burn of first degree of right wrist, initial encounter: Secondary | ICD-10-CM | POA: Diagnosis not present

## 2017-07-11 DIAGNOSIS — Y9389 Activity, other specified: Secondary | ICD-10-CM | POA: Insufficient documentation

## 2017-07-11 MED ORDER — METHOCARBAMOL 500 MG PO TABS: 500.0000 mg | ORAL_TABLET | Freq: Four times a day (QID) | ORAL | 0 refills | Status: DC

## 2017-07-11 MED ORDER — BACITRACIN-NEOMYCIN-POLYMYXIN 400-5-5000 EX OINT
TOPICAL_OINTMENT | Freq: Once | CUTANEOUS | Status: AC
  Administered 2017-07-11: 1 via TOPICAL
  Filled 2017-07-11: qty 1

## 2017-07-11 MED ORDER — IBUPROFEN 600 MG PO TABS: 600.0000 mg | ORAL_TABLET | Freq: Three times a day (TID) | ORAL | 0 refills | Status: DC | PRN

## 2017-07-11 MED ORDER — HYDROCODONE-ACETAMINOPHEN 5-325 MG PO TABS: 1.0000 | ORAL_TABLET | ORAL | 0 refills | Status: DC | PRN

## 2017-07-11 NOTE — Discharge Instructions (Signed)
Follow-up with your primary care provider or [facility] acute care if any continued problems.  Begin taking ibuprofen 600 mg every 8 hours with food and Robaxin 500 mg every 6 hours as needed for muscle spasms.  Also watch your wrist burn for any signs of infection.  Wear wrist splint for protection and support.  You may also apply ice to your shoulder as needed for discomfort.  You will be more sore the next 2 days then you are currently.  Do not drive or operate machinery while taking the muscle relaxant as it could cause drowsiness.
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
## 2017-07-26 DIAGNOSIS — H903 Sensorineural hearing loss, bilateral: Secondary | ICD-10-CM | POA: Insufficient documentation

## 2017-07-26 DIAGNOSIS — H9313 Tinnitus, bilateral: Secondary | ICD-10-CM | POA: Insufficient documentation

## 2017-10-28 ENCOUNTER — Ambulatory Visit
Admission: EM | Admit: 2017-10-28 | Discharge: 2017-10-28 | Disposition: A | Discharge status: Home / Self Care | Payer: Managed Care, Other (non HMO) | Attending: Family Medicine | Admitting: Family Medicine

## 2017-10-28 DIAGNOSIS — M10071 Idiopathic gout, right ankle and foot: Secondary | ICD-10-CM

## 2017-10-28 DIAGNOSIS — M109 Gout, unspecified: Secondary | ICD-10-CM

## 2017-10-28 MED ORDER — INDOMETHACIN 50 MG PO CAPS: ORAL_CAPSULE | ORAL | 1 refills | Status: DC

## 2017-10-28 NOTE — ED Triage Notes (Signed)
Pt has a hx of gout and doesn't have a pcp and is out of his idamycin. It's his right big toe.

## 2017-10-28 NOTE — ED Provider Notes (Signed)
[facility]    CSN: [id number] Arrival date & time: 10/28/17  5784  History   Chief Complaint Chief Complaint  Patient presents with  . Gout   HPI  47 year old male with a history of gout presents with a gout flare.  Started last night abruptly.  Right great toe pain, swelling, and decreased range of motion.  Severe.  Patient states that he normally gets treated with indomethacin.  He does not have any at home.  No recent trauma, fall, injury.  No medications or interventions tried.  No other associated symptoms.  No other complaints.  PMH, Surgical Hx, Family Hx, Social History reviewed and updated as below.  Past Medical History:  Diagnosis Date  . Cough   . Gout   . Lumbago   . URI (upper respiratory infection)    Patient Active Problem List   Diagnosis Date Noted  . Cough    Past Surgical History:  Procedure Laterality Date  . FACIAL RECONSTRUCTION SURGERY    . SHOULDER SURGERY     Home Medications    Prior to Admission medications   Medication Sig Start Date End Date Taking? Authorizing Provider  indomethacin (INDOCIN) 50 MG capsule 50 mg 3 times daily; initiate within 24 to 48 hours of flare onset preferably; discontinue 2 to 3 days after resolution of clinical signs 10/28/17   Coral Spikes, DO   Family History Family History  Problem Relation Age of Onset  . Dementia Maternal Grandfather   . Colon cancer Maternal Grandfather   . Gout Father   . Heart failure Paternal Grandfather   . Heart disease Mother   . Allergies Mother    Social History Social History   Tobacco Use  . Smoking status: Never Smoker  . Smokeless tobacco: Never Used  Substance Use Topics  . Alcohol use: No  . Drug use: No     Allergies   Patient has no known allergies.   Review of Systems Review of Systems  Constitutional: Negative.   Musculoskeletal:       Right great toe pain.   Physical Exam Triage Vital Signs ED Triage Vitals  Enc Vitals Group     BP  10/28/17 0840 123/78     Pulse Rate 10/28/17 0840 88     Resp 10/28/17 0840 18     Temp 10/28/17 0840 98.1 F (36.7 C)     Temp Source 10/28/17 0840 Oral     SpO2 10/28/17 0840 100 %     Weight 10/28/17 0842 200 lb (90.7 kg)     Height --      Head Circumference --      Peak Flow --      Pain Score 10/28/17 0842 10     Pain Loc --      Pain Edu? --      Excl. in [place]? --    Updated Vital Signs BP 123/78 (BP Location: Left Arm)   Pulse 88   Temp 98.1 F (36.7 C) (Oral)   Resp 18   Wt 90.7 kg   SpO2 100%   BMI 28.70 kg/m   Visual Acuity Right Eye Distance:   Left Eye Distance:   Bilateral Distance:    Right Eye Near:   Left Eye Near:    Bilateral Near:     Physical Exam  Constitutional: He is oriented to person, place, and time. He appears well-developed. No distress.  HENT:  Head: Normocephalic and atraumatic.  Cardiovascular: Normal  rate and regular rhythm.  Pulmonary/Chest: Effort normal. No respiratory distress.  Musculoskeletal:  Right great toe -severe pain with range of motion.  Exquisite tenderness of the MTP joint.  Neurological: He is alert and oriented to person, place, and time.  Psychiatric: He has a normal mood and affect. His behavior is normal.  Nursing note and vitals reviewed.  UC Treatments / Results  Labs (all labs ordered are listed, but only abnormal results are displayed) Labs Reviewed - No data to display  EKG None  Radiology No results found.  Procedures Procedures (including critical care time)  Medications Ordered in UC Medications - No data to display  Initial Impression / Assessment and Plan / UC Course  I have reviewed the triage vital signs and the nursing notes.  Pertinent labs & imaging results that were available during my care of the patient were reviewed by me and considered in my medical decision making (see chart for details).    47 year old male presents with a gout flare.  Treating with indomethacin.  Final  Clinical Impressions(s) / UC Diagnoses   Final diagnoses:  Acute gout involving toe of right foot, unspecified cause   Discharge Instructions   None    ED Prescriptions    Medication Sig Dispense Auth. Provider   indomethacin (INDOCIN) 50 MG capsule 50 mg 3 times daily; initiate within 24 to 48 hours of flare onset preferably; discontinue 2 to 3 days after resolution of clinical signs 30 capsule Coral Spikes, DO     Controlled Substance Prescriptions [place] Controlled Substance Registry consulted? Not Applicable   Coral Spikes, [place] 10/28/17 2952

## 2017-11-11 ENCOUNTER — Ambulatory Visit
Admission: EM | Admit: 2017-11-11 | Discharge: 2017-11-11 | Disposition: A | Discharge status: Home / Self Care | Payer: Managed Care, Other (non HMO) | Attending: Family Medicine | Admitting: Family Medicine

## 2017-11-11 ENCOUNTER — Ambulatory Visit (INDEPENDENT_AMBULATORY_CARE_PROVIDER_SITE_OTHER): Payer: Managed Care, Other (non HMO)

## 2017-11-11 ENCOUNTER — Encounter: Payer: Self-pay | Admitting: Gynecology

## 2017-11-11 DIAGNOSIS — W19XXXA Unspecified fall, initial encounter: Secondary | ICD-10-CM | POA: Diagnosis not present

## 2017-11-11 DIAGNOSIS — S29011A Strain of muscle and tendon of front wall of thorax, initial encounter: Secondary | ICD-10-CM

## 2017-11-11 DIAGNOSIS — R0789 Other chest pain: Secondary | ICD-10-CM | POA: Diagnosis not present

## 2017-11-11 MED ORDER — HYDROCODONE-ACETAMINOPHEN 5-325 MG PO TABS: ORAL_TABLET | ORAL | 0 refills | Status: DC

## 2017-11-11 MED ORDER — CYCLOBENZAPRINE HCL 10 MG PO TABS: 10.0000 mg | ORAL_TABLET | Freq: Every day | ORAL | 0 refills | Status: DC

## 2017-11-11 NOTE — ED Triage Notes (Signed)
Per patient fell x last night on hardwood floor when injury his left shoulder.

## 2017-11-12 NOTE — ED Provider Notes (Signed)
[facility]    CSN: [id number] Arrival date & time: 11/11/17  1134     History   Chief Complaint Chief Complaint  Patient presents with  . Shoulder Injury    HPI Troy Solomon is a 47 y.o. male.   47 yo male with a c/o left sided lateral chest wall pain since falling last night and landing on his left side with his outstretched arm. States pain is worse with movement and taking deep breaths.   The history is provided by the patient.  Shoulder Injury     Past Medical History:  Diagnosis Date  . Cough   . Gout   . Lumbago   . URI (upper respiratory infection)     Patient Active Problem List   Diagnosis Date Noted  . Cough     Past Surgical History:  Procedure Laterality Date  . FACIAL RECONSTRUCTION SURGERY    . SHOULDER SURGERY         Home Medications    Prior to Admission medications   Medication Sig Start Date End Date Taking? Authorizing Provider  indomethacin (INDOCIN) 50 MG capsule 50 mg 3 times daily; initiate within 24 to 48 hours of flare onset preferably; discontinue 2 to 3 days after resolution of clinical signs 10/28/17  Yes Lacinda Axon, Jayce G, DO  cyclobenzaprine (FLEXERIL) 10 MG tablet Take 1 tablet (10 mg total) by mouth at bedtime. 11/11/17   Norval Gable, MD  HYDROcodone-acetaminophen (NORCO/VICODIN) 5-325 MG tablet 1-2 tabs po qd prn 11/11/17   Norval Gable, MD    Family History Family History  Problem Relation Age of Onset  . Dementia Maternal Grandfather   . Colon cancer Maternal Grandfather   . Gout Father   . Heart failure Paternal Grandfather   . Heart disease Mother   . Allergies Mother     Social History Social History   Tobacco Use  . Smoking status: Never Smoker  . Smokeless tobacco: Never Used  Substance Use Topics  . Alcohol use: No  . Drug use: No     Allergies   Patient has no known allergies.   Review of Systems Review of Systems   Physical Exam Triage Vital Signs ED Triage Vitals  Enc  Vitals Group     BP 11/11/17 1143 133/62     Pulse Rate 11/11/17 1143 83     Resp 11/11/17 1143 16     Temp 11/11/17 1143 98.5 F (36.9 C)     Temp Source 11/11/17 1143 Oral     SpO2 11/11/17 1143 100 %     Weight 11/11/17 1143 195 lb (88.5 kg)     Height 11/11/17 1143 5\' 10"  (1.778 m)     Head Circumference --      Peak Flow --      Pain Score 11/11/17 1225 0     Pain Loc --      Pain Edu? --      Excl. in [place]? --    No data found.  Updated Vital Signs BP 133/62 (BP Location: Right Arm)   Pulse 83   Temp 98.5 F (36.9 C) (Oral)   Resp 16   Ht 5\' 10"  (1.778 m)   Wt 88.5 kg   SpO2 100%   BMI 27.98 kg/m   Visual Acuity Right Eye Distance:   Left Eye Distance:   Bilateral Distance:    Right Eye Near:   Left Eye Near:    Bilateral Near:  Physical Exam  Constitutional: He appears well-developed and well-nourished. No distress.  Cardiovascular: Normal rate, regular rhythm, normal heart sounds and intact distal pulses.  Pulmonary/Chest: Effort normal. No stridor. No respiratory distress. He has no wheezes. He has no rales. He exhibits tenderness (left lateral chest wall).  Musculoskeletal:       Left shoulder: Normal.  Skin: He is not diaphoretic.  Nursing note and vitals reviewed.    UC Treatments / Results  Labs (all labs ordered are listed, but only abnormal results are displayed) Labs Reviewed - No data to display  EKG None  Radiology Dg Ribs Unilateral W/chest Left  Result Date: 11/11/2017 CLINICAL DATA:  [address] and hit left lateral side of chest. Pain just below and lateral to scapula. EXAM: LEFT RIBS AND CHEST - 3+ VIEW COMPARISON:  None. FINDINGS: No fracture.  No rib lesion. Heart, mediastinum and hila are within normal limits. Lungs are clear.  No pleural effusion or pneumothorax. IMPRESSION: Negative. Electronically Signed   By: Lajean Manes M.D.   On: 11/11/2017 12:16    Procedures Procedures (including critical care time)  Medications Ordered  in UC Medications - No data to display  Initial Impression / Assessment and Plan / UC Course  I have reviewed the triage vital signs and the nursing notes.  Pertinent labs & imaging results that were available during my care of the patient were reviewed by me and considered in my medical decision making (see chart for details).      Final Clinical Impressions(s) / UC Diagnoses   Final diagnoses:  Muscle strain of chest wall, initial encounter    ED Prescriptions    Medication Sig Dispense Auth. Provider   cyclobenzaprine (FLEXERIL) 10 MG tablet Take 1 tablet (10 mg total) by mouth at bedtime. 30 tablet Norval Gable, MD   HYDROcodone-acetaminophen (NORCO/VICODIN) 5-325 MG tablet 1-2 tabs po qd prn 6 tablet Norval Gable, MD     1. x-ray results and diagnosis reviewed with patient 2. rx as per orders above; reviewed possible side effects, interactions, risks and benefits  3. Recommend supportive treatment rest, ice 4. Follow-up prn if symptoms worsen or don't improve   Controlled Substance Prescriptions [place] Controlled Substance Registry consulted? Not Applicable   Norval Gable, MD 11/12/17 [phone]

## 2018-05-03 DIAGNOSIS — F32A Depression, unspecified: Secondary | ICD-10-CM | POA: Insufficient documentation

## 2018-05-03 DIAGNOSIS — Z9889 Other specified postprocedural states: Secondary | ICD-10-CM | POA: Insufficient documentation

## 2018-05-03 DIAGNOSIS — H2633 Drug-induced cataract, bilateral: Secondary | ICD-10-CM | POA: Insufficient documentation

## 2018-07-09 ENCOUNTER — Encounter: Payer: Self-pay | Admitting: *Deleted

## 2018-07-09 ENCOUNTER — Other Ambulatory Visit: Payer: Self-pay

## 2018-07-12 NOTE — Discharge Instructions (Signed)

## 2018-07-13 ENCOUNTER — Encounter
Admission: RE | Admit: 2018-07-13 | Discharge: 2018-07-13 | Disposition: A | Discharge status: Home / Self Care | Payer: Managed Care, Other (non HMO) | Source: Ambulatory Visit | Attending: Ophthalmology | Admitting: Ophthalmology

## 2018-07-13 ENCOUNTER — Other Ambulatory Visit: Payer: Self-pay

## 2018-07-13 DIAGNOSIS — Z1159 Encounter for screening for other viral diseases: Secondary | ICD-10-CM | POA: Diagnosis not present

## 2018-07-13 DIAGNOSIS — Z01812 Encounter for preprocedural laboratory examination: Secondary | ICD-10-CM | POA: Insufficient documentation

## 2018-07-14 LAB — NOVEL CORONAVIRUS, NAA (HOSP ORDER, SEND-OUT TO REF LAB; TAT 18-24 HRS): SARS-CoV-2, NAA: NOT DETECTED

## 2018-07-17 ENCOUNTER — Ambulatory Visit: Payer: Managed Care, Other (non HMO) | Admitting: Anesthesiology

## 2018-07-17 ENCOUNTER — Encounter
Admission: RE | Disposition: A | Discharge status: Home / Self Care | Payer: Self-pay | Source: Home / Self Care | Attending: Ophthalmology

## 2018-07-17 ENCOUNTER — Ambulatory Visit
Admission: RE | Admit: 2018-07-17 | Discharge: 2018-07-17 | Disposition: A | Discharge status: Home / Self Care | Payer: Managed Care, Other (non HMO) | Attending: Ophthalmology | Admitting: Ophthalmology

## 2018-07-17 ENCOUNTER — Other Ambulatory Visit: Payer: Self-pay

## 2018-07-17 DIAGNOSIS — H2511 Age-related nuclear cataract, right eye: Secondary | ICD-10-CM | POA: Diagnosis not present

## 2018-07-17 DIAGNOSIS — Z79899 Other long term (current) drug therapy: Secondary | ICD-10-CM | POA: Diagnosis not present

## 2018-07-17 HISTORY — PX: CATARACT EXTRACTION W/PHACO: SHX586

## 2018-07-17 HISTORY — DX: Nausea with vomiting, unspecified: R11.2

## 2018-07-17 HISTORY — DX: Presence of spectacles and contact lenses: Z97.3

## 2018-07-17 HISTORY — DX: Other specified postprocedural states: Z98.890

## 2018-07-17 SURGERY — PHACOEMULSIFICATION, CATARACT, WITH IOL INSERTION
Anesthesia: Monitor Anesthesia Care | Site: Eye | Laterality: Right

## 2018-07-17 MED ORDER — TETRACAINE HCL 0.5 % OP SOLN
1.0000 [drp] | OPHTHALMIC | Status: DC | PRN
  Administered 2018-07-17 (×3): 1 [drp] via OPHTHALMIC

## 2018-07-17 MED ORDER — LIDOCAINE HCL (PF) 2 % IJ SOLN
INTRAOCULAR | Status: DC | PRN
  Administered 2018-07-17: 1 mL

## 2018-07-17 MED ORDER — MIDAZOLAM HCL 2 MG/2ML IJ SOLN
INTRAMUSCULAR | Status: DC | PRN
  Administered 2018-07-17: 2 mg via INTRAVENOUS

## 2018-07-17 MED ORDER — BRIMONIDINE TARTRATE-TIMOLOL 0.2-0.5 % OP SOLN
OPHTHALMIC | Status: DC | PRN
  Administered 2018-07-17: 1 [drp] via OPHTHALMIC

## 2018-07-17 MED ORDER — LACTATED RINGERS IV SOLN: 10.0000 mL/h | INTRAVENOUS | Status: DC

## 2018-07-17 MED ORDER — FENTANYL CITRATE (PF) 100 MCG/2ML IJ SOLN
INTRAMUSCULAR | Status: DC | PRN
  Administered 2018-07-17: 100 ug via INTRAVENOUS

## 2018-07-17 MED ORDER — EPINEPHRINE PF 1 MG/ML IJ SOLN
INTRAOCULAR | Status: DC | PRN
  Administered 2018-07-17: 100 mL via OPHTHALMIC

## 2018-07-17 MED ORDER — ONDANSETRON HCL 4 MG/2ML IJ SOLN: 4.0000 mg | Freq: Once | INTRAMUSCULAR | Status: DC | PRN

## 2018-07-17 MED ORDER — NA CHONDROIT SULF-NA HYALURON 40-17 MG/ML IO SOLN
INTRAOCULAR | Status: DC | PRN
  Administered 2018-07-17: 1 mL via INTRAOCULAR

## 2018-07-17 MED ORDER — MOXIFLOXACIN HCL 0.5 % OP SOLN
OPHTHALMIC | Status: DC | PRN
  Administered 2018-07-17: 0.2 mL via OPHTHALMIC

## 2018-07-17 MED ORDER — IBUPROFEN 600 MG PO TABS
600.0000 mg | ORAL_TABLET | Freq: Once | ORAL | Status: AC
  Administered 2018-07-17: 600 mg via ORAL

## 2018-07-17 MED ORDER — ARMC OPHTHALMIC DILATING DROPS
1.0000 | OPHTHALMIC | Status: DC | PRN
Start: 2018-07-17 — End: 2018-07-17
  Administered 2018-07-17 (×3): 1 via OPHTHALMIC

## 2018-07-17 SURGICAL SUPPLY — 21 items
ACRYSOF IQ PANOPTIX (Intraocular Lens) ×2 IMPLANT
CANNULA ANT/CHMB 27G (MISCELLANEOUS) ×1 IMPLANT
CANNULA ANT/CHMB 27GA (MISCELLANEOUS) ×3 IMPLANT
GLOVE SURG LX 8.0 MICRO (GLOVE) ×2
GLOVE SURG LX STRL 8.0 MICRO (GLOVE) ×1 IMPLANT
GLOVE SURG TRIUMPH 8.0 PF LTX (GLOVE) ×3 IMPLANT
GOWN STRL REUS W/ TWL LRG LVL3 (GOWN DISPOSABLE) ×2 IMPLANT
GOWN STRL REUS W/TWL LRG LVL3 (GOWN DISPOSABLE) ×4
MARKER SKIN DUAL TIP RULER LAB (MISCELLANEOUS) ×3 IMPLANT
NDL FILTER BLUNT 18X1 1/2 (NEEDLE) ×1 IMPLANT
NDL RETROBULBAR .5 NSTRL (NEEDLE) ×3 IMPLANT
NEEDLE FILTER BLUNT 18X 1/2SAF (NEEDLE) ×2
NEEDLE FILTER BLUNT 18X1 1/2 (NEEDLE) ×1 IMPLANT
PACK CATARACT BRASINGTON (MISCELLANEOUS) ×3 IMPLANT
PACK EYE AFTER SURG (MISCELLANEOUS) ×3 IMPLANT
PACK OPTHALMIC (MISCELLANEOUS) ×3 IMPLANT
SUT ETHILON 10-0 CS-B-6CS-B-6 (SUTURE)
SUTURE EHLN 10-0 CS-B-6CS-B-6 (SUTURE) IMPLANT
SYR 3ML LL SCALE MARK (SYRINGE) ×3 IMPLANT
SYR TB 1ML LUER SLIP (SYRINGE) ×3 IMPLANT
WIPE NON LINTING 3.25X3.25 (MISCELLANEOUS) ×3 IMPLANT

## 2018-07-17 NOTE — Anesthesia Preprocedure Evaluation (Addendum)
Anesthesia Evaluation  Patient identified by MRN, date of birth, ID band Patient awake    Reviewed: Allergy & Precautions, H&P , NPO status , Patient's Chart, lab work & pertinent test results  History of Anesthesia Complications (+) PONV  Airway Mallampati: II  TM Distance: >3 FB Neck ROM: full    Dental no notable dental hx.    Pulmonary neg pulmonary ROS,    Pulmonary exam normal breath sounds clear to auscultation       Cardiovascular negative cardio ROS Normal cardiovascular exam Rhythm:regular Rate:Normal     Neuro/Psych negative neurological ROS     GI/Hepatic negative GI ROS, Neg liver ROS,   Endo/Other  negative endocrine ROS  Renal/GU negative Renal ROS  negative genitourinary   Musculoskeletal   Abdominal   Peds  Hematology negative hematology ROS (+)   Anesthesia Other Findings   Reproductive/Obstetrics                            Anesthesia Physical Anesthesia Plan  ASA: II  Anesthesia Plan: MAC   Post-op Pain Management:    Induction:   PONV Risk Score and Plan:   Airway Management Planned:   Additional Equipment:   Intra-op Plan:   Post-operative Plan:   Informed Consent: I have reviewed the patients History and Physical, chart, labs and discussed the procedure including the risks, benefits and alternatives for the proposed anesthesia with the patient or authorized representative who has indicated his/her understanding and acceptance.       Plan Discussed with:   Anesthesia Plan Comments:         Anesthesia Quick Evaluation

## 2018-07-17 NOTE — H&P (Signed)
All labs reviewed. Abnormal studies sent to patients PCP when indicated.  Previous H&P reviewed, patient examined, there are NO CHANGES.  Troy Weider Porfilio5/26/20203:37 PM

## 2018-07-17 NOTE — Anesthesia Procedure Notes (Signed)
Procedure Name: MAC Performed by: Fedra Lanter, CRNA Pre-anesthesia Checklist: Patient identified, Emergency Drugs available, Suction available, Timeout performed and Patient being monitored Patient Re-evaluated:Patient Re-evaluated prior to induction Oxygen Delivery Method: Nasal cannula Placement Confirmation: positive ETCO2       

## 2018-07-17 NOTE — Anesthesia Postprocedure Evaluation (Signed)
Anesthesia Post Note  Patient: Troy Solomon  Procedure(s) Performed: CATARACT EXTRACTION PHACO AND INTRAOCULAR LENS PLACEMENT ([facility]) RIGHT  PANOPTIC LENS (Right Eye)  Patient location during evaluation: PACU Anesthesia Type: MAC Level of consciousness: awake and alert Pain management: pain level controlled Vital Signs Assessment: post-procedure vital signs reviewed and stable Respiratory status: spontaneous breathing Cardiovascular status: stable Anesthetic complications: no    Jaci Standard, III,  Murat Rideout D

## 2018-07-17 NOTE — Transfer of Care (Signed)
Immediate Anesthesia Transfer of Care Note  Patient: Troy Solomon  Procedure(s) Performed: CATARACT EXTRACTION PHACO AND INTRAOCULAR LENS PLACEMENT ([facility]) RIGHT  PANOPTIC LENS (Right Eye)  Patient Location: PACU  Anesthesia Type: MAC  Level of Consciousness: awake, alert  and patient cooperative  Airway and Oxygen Therapy: Patient Spontanous Breathing and Patient connected to supplemental oxygen  Post-op Assessment: Post-op Vital signs reviewed, Patient's Cardiovascular Status Stable, Respiratory Function Stable, Patent Airway and No signs of Nausea or vomiting  Post-op Vital Signs: Reviewed and stable  Complications: No apparent anesthesia complications

## 2018-07-17 NOTE — Op Note (Signed)
PREOPERATIVE DIAGNOSIS:  Nuclear sclerotic cataract of the right eye.   POSTOPERATIVE DIAGNOSIS:  H25.11 CATARACT   OPERATIVE PROCEDURE: Procedure(s): CATARACT EXTRACTION PHACO AND INTRAOCULAR LENS PLACEMENT ([place]) RIGHT  PANOPTIC LENS   SURGEON:  Birder Robson, MD.   ANESTHESIA:  Anesthesiologist: Elgie Collard, MD CRNA: Mayme Genta, CRNA  1.      Managed anesthesia care. 2.      0.28ml of Shugarcaine was instilled in the eye following the paracentesis.   COMPLICATIONS:  None.   TECHNIQUE:   Stop and chop   DESCRIPTION OF PROCEDURE:  The patient was examined and consented in the preoperative holding area where the aforementioned topical anesthesia was applied to the right eye and then brought back to the Operating Room where the right eye was prepped and draped in the usual sterile ophthalmic fashion and a lid speculum was placed. A paracentesis was created with the side port blade and the anterior chamber was filled with viscoelastic. A near clear corneal incision was performed with the steel keratome. A continuous curvilinear capsulorrhexis was performed with a cystotome followed by the capsulorrhexis forceps. Hydrodissection and hydrodelineation were carried out with BSS on a blunt cannula. The lens was removed in a stop and chop  technique and the remaining cortical material was removed with the irrigation-aspiration handpiece. The capsular bag was inflated with viscoelastic and the Technis [id number] 15D  lens was placed in the capsular bag without complication. The remaining viscoelastic was removed from the eye with the irrigation-aspiration handpiece. The wounds were hydrated. The anterior chamber was flushed with BSSand the eye was inflated to physiologic pressure. 0.10ml of Vigamox was placed in the anterior chamber. The wounds were found to be water tight. The eye was dressed with Combigan. The patient was given protective glasses to wear throughout the day and a shield with which  to sleep tonight. The patient was also given drops with which to begin a drop regimen today and will follow-up with me in one day. Implant Name Type Inv. Item Serial No. Manufacturer Lot No. LRB No. Used         1   Procedure(s): CATARACT EXTRACTION PHACO AND INTRAOCULAR LENS PLACEMENT ([facility]) RIGHT  PANOPTIC LENS (Right)  Electronically signed: Birder Robson 07/17/2018 4:04 PM

## 2018-07-18 ENCOUNTER — Encounter: Payer: Self-pay | Admitting: Ophthalmology

## 2018-08-07 ENCOUNTER — Other Ambulatory Visit: Payer: Self-pay

## 2018-08-07 ENCOUNTER — Encounter: Payer: Self-pay | Admitting: *Deleted

## 2018-08-07 NOTE — H&P (Signed)
All labs reviewed. Abnormal studies sent to patients PCP when indicated.  Previous H&P reviewed, patient examined, there are NO CHANGES.  Troy Daman Porfilio6/16/20201:28 PM

## 2018-08-09 NOTE — Discharge Instructions (Signed)

## 2018-08-10 ENCOUNTER — Other Ambulatory Visit: Payer: Self-pay

## 2018-08-10 ENCOUNTER — Other Ambulatory Visit
Admission: RE | Admit: 2018-08-10 | Discharge: 2018-08-10 | Disposition: A | Discharge status: Home / Self Care | Payer: Managed Care, Other (non HMO) | Source: Ambulatory Visit | Attending: Ophthalmology | Admitting: Ophthalmology

## 2018-08-10 DIAGNOSIS — Z1159 Encounter for screening for other viral diseases: Secondary | ICD-10-CM | POA: Insufficient documentation

## 2018-08-11 LAB — NOVEL CORONAVIRUS, NAA (HOSP ORDER, SEND-OUT TO REF LAB; TAT 18-24 HRS): SARS-CoV-2, NAA: NOT DETECTED

## 2018-08-14 ENCOUNTER — Ambulatory Visit
Admission: RE | Admit: 2018-08-14 | Discharge: 2018-08-14 | Disposition: A | Discharge status: Home / Self Care | Payer: Managed Care, Other (non HMO) | Attending: Ophthalmology | Admitting: Ophthalmology

## 2018-08-14 ENCOUNTER — Other Ambulatory Visit: Payer: Self-pay

## 2018-08-14 ENCOUNTER — Ambulatory Visit: Payer: Managed Care, Other (non HMO) | Admitting: Anesthesiology

## 2018-08-14 ENCOUNTER — Encounter
Admission: RE | Disposition: A | Discharge status: Home / Self Care | Payer: Self-pay | Source: Home / Self Care | Attending: Ophthalmology

## 2018-08-14 DIAGNOSIS — Z9849 Cataract extraction status, unspecified eye: Secondary | ICD-10-CM | POA: Diagnosis not present

## 2018-08-14 DIAGNOSIS — F329 Major depressive disorder, single episode, unspecified: Secondary | ICD-10-CM | POA: Insufficient documentation

## 2018-08-14 DIAGNOSIS — Z79899 Other long term (current) drug therapy: Secondary | ICD-10-CM | POA: Insufficient documentation

## 2018-08-14 DIAGNOSIS — H9319 Tinnitus, unspecified ear: Secondary | ICD-10-CM | POA: Diagnosis not present

## 2018-08-14 DIAGNOSIS — H2512 Age-related nuclear cataract, left eye: Secondary | ICD-10-CM | POA: Insufficient documentation

## 2018-08-14 DIAGNOSIS — F419 Anxiety disorder, unspecified: Secondary | ICD-10-CM | POA: Insufficient documentation

## 2018-08-14 HISTORY — PX: CATARACT EXTRACTION W/PHACO: SHX586

## 2018-08-14 SURGERY — PHACOEMULSIFICATION, CATARACT, WITH IOL INSERTION
Anesthesia: Monitor Anesthesia Care | Site: Eye | Laterality: Left

## 2018-08-14 MED ORDER — TETRACAINE HCL 0.5 % OP SOLN
1.0000 [drp] | OPHTHALMIC | Status: DC | PRN
  Administered 2018-08-14 (×3): 1 [drp] via OPHTHALMIC

## 2018-08-14 MED ORDER — EPINEPHRINE PF 1 MG/ML IJ SOLN
INTRAOCULAR | Status: DC | PRN
  Administered 2018-08-14: 50 mL via OPHTHALMIC

## 2018-08-14 MED ORDER — BRIMONIDINE TARTRATE-TIMOLOL 0.2-0.5 % OP SOLN
OPHTHALMIC | Status: DC | PRN
  Administered 2018-08-14: 1 [drp] via OPHTHALMIC

## 2018-08-14 MED ORDER — MOXIFLOXACIN HCL 0.5 % OP SOLN
OPHTHALMIC | Status: DC | PRN
  Administered 2018-08-14: 0.2 mL via OPHTHALMIC

## 2018-08-14 MED ORDER — LACTATED RINGERS IV SOLN: INTRAVENOUS | Status: DC

## 2018-08-14 MED ORDER — ARMC OPHTHALMIC DILATING DROPS
1.0000 "application " | OPHTHALMIC | Status: DC | PRN
  Administered 2018-08-14 (×3): 1 via OPHTHALMIC

## 2018-08-14 MED ORDER — MIDAZOLAM HCL 2 MG/2ML IJ SOLN
INTRAMUSCULAR | Status: DC | PRN
  Administered 2018-08-14: 2 mg via INTRAVENOUS

## 2018-08-14 MED ORDER — NA CHONDROIT SULF-NA HYALURON 40-17 MG/ML IO SOLN
INTRAOCULAR | Status: DC | PRN
  Administered 2018-08-14: 1 mL via INTRAOCULAR

## 2018-08-14 MED ORDER — LIDOCAINE HCL (PF) 2 % IJ SOLN
INTRAOCULAR | Status: DC | PRN
  Administered 2018-08-14: 1 mL

## 2018-08-14 MED ORDER — FENTANYL CITRATE (PF) 100 MCG/2ML IJ SOLN
INTRAMUSCULAR | Status: DC | PRN
  Administered 2018-08-14: 100 ug via INTRAVENOUS

## 2018-08-14 MED ORDER — ONDANSETRON HCL 4 MG/2ML IJ SOLN: 4.0000 mg | Freq: Once | INTRAMUSCULAR | Status: DC | PRN

## 2018-08-14 SURGICAL SUPPLY — 21 items
ACRYSOF IQ PANOPTIC UV IOL (Intraocular Lens) ×2 IMPLANT
CANNULA ANT/CHMB 27G (MISCELLANEOUS) ×1 IMPLANT
CANNULA ANT/CHMB 27GA (MISCELLANEOUS) ×3 IMPLANT
GLOVE SURG LX 8.0 MICRO (GLOVE) ×4
GLOVE SURG LX STRL 8.0 MICRO (GLOVE) ×1 IMPLANT
GLOVE SURG TRIUMPH 8.0 PF LTX (GLOVE) ×3 IMPLANT
GOWN STRL REUS W/ TWL LRG LVL3 (GOWN DISPOSABLE) ×2 IMPLANT
GOWN STRL REUS W/TWL LRG LVL3 (GOWN DISPOSABLE) ×4
MARKER SKIN DUAL TIP RULER LAB (MISCELLANEOUS) ×3 IMPLANT
NDL FILTER BLUNT 18X1 1/2 (NEEDLE) ×1 IMPLANT
NDL RETROBULBAR .5 NSTRL (NEEDLE) ×3 IMPLANT
NEEDLE FILTER BLUNT 18X 1/2SAF (NEEDLE) ×2
NEEDLE FILTER BLUNT 18X1 1/2 (NEEDLE) ×1 IMPLANT
PACK EYE AFTER SURG (MISCELLANEOUS) ×3 IMPLANT
PACK OPTHALMIC (MISCELLANEOUS) ×3 IMPLANT
PACK PORFILIO (MISCELLANEOUS) ×3 IMPLANT
SUT ETHILON 10-0 CS-B-6CS-B-6 (SUTURE)
SUTURE EHLN 10-0 CS-B-6CS-B-6 (SUTURE) IMPLANT
SYR 3ML LL SCALE MARK (SYRINGE) ×3 IMPLANT
SYR TB 1ML LUER SLIP (SYRINGE) ×3 IMPLANT
WIPE NON LINTING 3.25X3.25 (MISCELLANEOUS) ×3 IMPLANT

## 2018-08-14 NOTE — Op Note (Signed)
PREOPERATIVE DIAGNOSIS:  Nuclear sclerotic cataract of the left eye.   POSTOPERATIVE DIAGNOSIS:  Nuclear sclerotic cataract of the left eye.   OPERATIVE PROCEDURE: Procedure(s): CATARACT EXTRACTION PHACO AND INTRAOCULAR LENS PLACEMENT ([place])  LEFT PANOPTIX LENS   SURGEON:  Birder Robson, MD.   ANESTHESIA:  Anesthesiologist: Veda Canning, MD CRNA: Janna Arch, CRNA  1.      Managed anesthesia care. 2.     0.3ml of Shugarcaine was instilled following the paracentesis   COMPLICATIONS:  None.   TECHNIQUE:   Stop and chop   DESCRIPTION OF PROCEDURE:  The patient was examined and consented in the preoperative holding area where the aforementioned topical anesthesia was applied to the left eye and then brought back to the Operating Room where the left eye was prepped and draped in the usual sterile ophthalmic fashion and a lid speculum was placed. A paracentesis was created with the side port blade and the anterior chamber was filled with viscoelastic. A near clear corneal incision was performed with the steel keratome. A continuous curvilinear capsulorrhexis was performed with a cystotome followed by the capsulorrhexis forceps. Hydrodissection and hydrodelineation were carried out with BSS on a blunt cannula. The lens was removed in a stop and chop  technique and the remaining cortical material was removed with the irrigation-aspiration handpiece. The capsular bag was inflated with viscoelastic and the Technis [id number] lens was placed in the capsular bag without complication. The remaining viscoelastic was removed from the eye with the irrigation-aspiration handpiece. The wounds were hydrated. The anterior chamber was flushed with BSS and the eye was inflated to physiologic pressure. 0.18ml Vigamox was placed in the anterior chamber. The wounds were found to be water tight. The eye was dressed with Combigan. The patient was given protective glasses to wear throughout the day and a shield with  which to sleep tonight. The patient was also given drops with which to begin a drop regimen today and will follow-up with me in one day. Implant Name Type Inv. Item Serial No. Manufacturer Lot No. LRB No. Used Action  ACRYSOF IQ PANOPTIC UV IOL Intraocular Lens  [id number] ALCON  Left 1 Implanted    Procedure(s): CATARACT EXTRACTION PHACO AND INTRAOCULAR LENS PLACEMENT ([facility])  LEFT PANOPTIX LENS (Left)  Electronically signed: Birder Robson 08/14/2018 9:36 AM

## 2018-08-14 NOTE — Anesthesia Postprocedure Evaluation (Signed)
Anesthesia Post Note  Patient: Troy Solomon  Procedure(s) Performed: CATARACT EXTRACTION PHACO AND INTRAOCULAR LENS PLACEMENT ([facility])  LEFT PANOPTIX LENS (Left Eye)  Patient location during evaluation: PACU Anesthesia Type: MAC Level of consciousness: awake and alert Pain management: pain level controlled Vital Signs Assessment: post-procedure vital signs reviewed and stable Respiratory status: spontaneous breathing, nonlabored ventilation, respiratory function stable and patient connected to nasal cannula oxygen Cardiovascular status: stable and blood pressure returned to baseline Postop Assessment: no apparent nausea or vomiting Anesthetic complications: no    Veda Canning

## 2018-08-14 NOTE — H&P (Signed)
All labs reviewed. Abnormal studies sent to patients PCP when indicated.  Previous H&P reviewed, patient examined, there are NO CHANGES.  Troy Sebek Porfilio6/23/20209:04 AM

## 2018-08-14 NOTE — Transfer of Care (Signed)
Immediate Anesthesia Transfer of Care Note  Patient: Troy Solomon  Procedure(s) Performed: CATARACT EXTRACTION PHACO AND INTRAOCULAR LENS PLACEMENT ([facility])  LEFT PANOPTIX LENS (Left Eye)  Patient Location: PACU  Anesthesia Type: MAC  Level of Consciousness: awake, alert  and patient cooperative  Airway and Oxygen Therapy: Patient Spontanous Breathing and Patient connected to supplemental oxygen  Post-op Assessment: Post-op Vital signs reviewed, Patient's Cardiovascular Status Stable, Respiratory Function Stable, Patent Airway and No signs of Nausea or vomiting  Post-op Vital Signs: Reviewed and stable  Complications: No apparent anesthesia complications

## 2018-08-14 NOTE — Anesthesia Preprocedure Evaluation (Signed)
Anesthesia Evaluation  Patient identified by MRN, date of birth, ID band Patient awake    Reviewed: Allergy & Precautions, H&P , NPO status , Patient's Chart, lab work & pertinent test results  History of Anesthesia Complications (+) PONV  Airway Mallampati: II  TM Distance: >3 FB Neck ROM: full    Dental no notable dental hx.    Pulmonary neg pulmonary ROS,    breath sounds clear to auscultation       Cardiovascular negative cardio ROS   Rhythm:regular Rate:Normal     Neuro/Psych negative neurological ROS     GI/Hepatic negative GI ROS,   Endo/Other  negative endocrine ROS  Renal/GU      Musculoskeletal   Abdominal   Peds  Hematology   Anesthesia Other Findings   Reproductive/Obstetrics                             Anesthesia Physical  Anesthesia Plan  ASA: II  Anesthesia Plan: MAC   Post-op Pain Management:    Induction:   PONV Risk Score and Plan:   Airway Management Planned:   Additional Equipment:   Intra-op Plan:   Post-operative Plan:   Informed Consent: I have reviewed the patients History and Physical, chart, labs and discussed the procedure including the risks, benefits and alternatives for the proposed anesthesia with the patient or authorized representative who has indicated his/her understanding and acceptance.       Plan Discussed with: CRNA  Anesthesia Plan Comments:         Anesthesia Quick Evaluation

## 2018-08-14 NOTE — Anesthesia Procedure Notes (Signed)
Procedure Name: MAC Date/Time: 08/14/2018 9:15 AM Performed by: Janna Arch, CRNA Pre-anesthesia Checklist: Patient identified, Emergency Drugs available, Suction available, Timeout performed and Patient being monitored Patient Re-evaluated:Patient Re-evaluated prior to induction Oxygen Delivery Method: Nasal cannula Placement Confirmation: positive ETCO2

## 2018-08-15 ENCOUNTER — Encounter: Payer: Self-pay | Admitting: Ophthalmology

## 2018-08-31 DIAGNOSIS — Z0000 Encounter for general adult medical examination without abnormal findings: Secondary | ICD-10-CM

## 2018-10-31 ENCOUNTER — Encounter: Payer: Self-pay | Admitting: Family Medicine

## 2018-11-07 ENCOUNTER — Other Ambulatory Visit: Payer: Self-pay

## 2018-11-07 DIAGNOSIS — Z1211 Encounter for screening for malignant neoplasm of colon: Secondary | ICD-10-CM

## 2018-11-07 MED ORDER — NA SULFATE-K SULFATE-MG SULF 17.5-3.13-1.6 GM/177ML PO SOLN: 1.0000 | Freq: Once | ORAL | 0 refills | Status: AC

## 2018-12-04 ENCOUNTER — Other Ambulatory Visit
Admission: RE | Admit: 2018-12-04 | Discharge: 2018-12-04 | Disposition: A | Discharge status: Home / Self Care | Payer: Managed Care, Other (non HMO) | Source: Ambulatory Visit | Attending: Gastroenterology | Admitting: Gastroenterology

## 2018-12-04 ENCOUNTER — Other Ambulatory Visit: Payer: Self-pay

## 2018-12-04 DIAGNOSIS — Z01812 Encounter for preprocedural laboratory examination: Secondary | ICD-10-CM | POA: Diagnosis present

## 2018-12-04 DIAGNOSIS — Z20828 Contact with and (suspected) exposure to other viral communicable diseases: Secondary | ICD-10-CM | POA: Diagnosis not present

## 2018-12-05 LAB — SARS CORONAVIRUS 2 (TAT 6-24 HRS): SARS Coronavirus 2: NEGATIVE

## 2018-12-06 NOTE — Discharge Instructions (Signed)

## 2018-12-07 ENCOUNTER — Other Ambulatory Visit: Payer: Self-pay

## 2018-12-07 ENCOUNTER — Ambulatory Visit
Admission: RE | Admit: 2018-12-07 | Discharge: 2018-12-07 | Disposition: A | Discharge status: Home / Self Care | Payer: Managed Care, Other (non HMO) | Attending: Gastroenterology | Admitting: Gastroenterology

## 2018-12-07 ENCOUNTER — Encounter
Admission: RE | Disposition: A | Discharge status: Home / Self Care | Payer: Self-pay | Source: Home / Self Care | Attending: Gastroenterology

## 2018-12-07 ENCOUNTER — Ambulatory Visit: Payer: Managed Care, Other (non HMO) | Admitting: Anesthesiology

## 2018-12-07 DIAGNOSIS — K635 Polyp of colon: Secondary | ICD-10-CM

## 2018-12-07 DIAGNOSIS — Z6831 Body mass index (BMI) 31.0-31.9, adult: Secondary | ICD-10-CM | POA: Diagnosis not present

## 2018-12-07 DIAGNOSIS — Z1211 Encounter for screening for malignant neoplasm of colon: Secondary | ICD-10-CM | POA: Diagnosis present

## 2018-12-07 DIAGNOSIS — Z79899 Other long term (current) drug therapy: Secondary | ICD-10-CM | POA: Diagnosis not present

## 2018-12-07 DIAGNOSIS — Z9841 Cataract extraction status, right eye: Secondary | ICD-10-CM | POA: Insufficient documentation

## 2018-12-07 DIAGNOSIS — D122 Benign neoplasm of ascending colon: Secondary | ICD-10-CM | POA: Insufficient documentation

## 2018-12-07 DIAGNOSIS — D120 Benign neoplasm of cecum: Secondary | ICD-10-CM | POA: Diagnosis not present

## 2018-12-07 DIAGNOSIS — Z9842 Cataract extraction status, left eye: Secondary | ICD-10-CM | POA: Diagnosis not present

## 2018-12-07 DIAGNOSIS — Z961 Presence of intraocular lens: Secondary | ICD-10-CM | POA: Insufficient documentation

## 2018-12-07 DIAGNOSIS — Z0000 Encounter for general adult medical examination without abnormal findings: Secondary | ICD-10-CM

## 2018-12-07 HISTORY — PX: POLYPECTOMY: SHX5525

## 2018-12-07 HISTORY — PX: COLONOSCOPY WITH PROPOFOL: SHX5780

## 2018-12-07 SURGERY — COLONOSCOPY WITH PROPOFOL
Anesthesia: General | Site: Rectum

## 2018-12-07 MED ORDER — PROPOFOL 10 MG/ML IV BOLUS
INTRAVENOUS | Status: DC | PRN
  Administered 2018-12-07 (×6): 30 mg via INTRAVENOUS
  Administered 2018-12-07: 140 mg via INTRAVENOUS
  Administered 2018-12-07: 30 mg via INTRAVENOUS

## 2018-12-07 MED ORDER — LIDOCAINE HCL (CARDIAC) PF 100 MG/5ML IV SOSY
PREFILLED_SYRINGE | INTRAVENOUS | Status: DC | PRN
  Administered 2018-12-07: 30 mg via INTRAVENOUS

## 2018-12-07 MED ORDER — STERILE WATER FOR IRRIGATION IR SOLN
Status: DC | PRN
  Administered 2018-12-07: 50 mL

## 2018-12-07 MED ORDER — LACTATED RINGERS IV SOLN
100.0000 mL/h | INTRAVENOUS | Status: DC
  Administered 2018-12-07: 100 mL/h via INTRAVENOUS

## 2018-12-07 SURGICAL SUPPLY — 17 items
CANISTER SUCT 1200ML W/VALVE (MISCELLANEOUS) ×3 IMPLANT
CLIP HMST 235XBRD CATH ROT (MISCELLANEOUS) ×4 IMPLANT
CLIP RESOLUTION 360 11X235 (MISCELLANEOUS) ×2
ELECT REM PT RETURN 9FT ADLT (ELECTROSURGICAL) ×3
ELECTRODE REM PT RTRN 9FT ADLT (ELECTROSURGICAL) ×2 IMPLANT
ELEVIEW  INJECTABLE COMP 10 (MISCELLANEOUS) ×1
FORCEPS BIOP RAD 4 LRG CAP 4 (CUTTING FORCEPS) ×3 IMPLANT
GOWN CVR UNV OPN BCK APRN NK (MISCELLANEOUS) ×4 IMPLANT
GOWN ISOL THUMB LOOP REG UNIV (MISCELLANEOUS) ×2
INJECTABLE ELEVIEW COMP 10 (MISCELLANEOUS) ×2 IMPLANT
INJECTOR VARIJECT VIN23 (MISCELLANEOUS) ×2 IMPLANT
KIT ENDO PROCEDURE OLY (KITS) ×3 IMPLANT
SNARE SHORT THROW 13M SML OVAL (MISCELLANEOUS) ×3 IMPLANT
SYR 10ML LL (SYRINGE) ×3 IMPLANT
TRAP ETRAP POLY (MISCELLANEOUS) ×3 IMPLANT
VARIJECT INJECTOR VIN23 (MISCELLANEOUS) ×3
WATER STERILE IRR 250ML POUR (IV SOLUTION) ×3 IMPLANT

## 2018-12-07 NOTE — Anesthesia Postprocedure Evaluation (Signed)
Anesthesia Post Note  Patient: Troy Solomon  Procedure(s) Performed: COLONOSCOPY WITH BIOPSY (N/A ) POLYPECTOMY (N/A Rectum)  Patient location during evaluation: PACU Anesthesia Type: General Level of consciousness: awake and alert Pain management: pain level controlled Vital Signs Assessment: post-procedure vital signs reviewed and stable Respiratory status: spontaneous breathing, nonlabored ventilation, respiratory function stable and patient connected to nasal cannula oxygen Cardiovascular status: blood pressure returned to baseline and stable Postop Assessment: no apparent nausea or vomiting Anesthetic complications: no    Donita Newland

## 2018-12-07 NOTE — Anesthesia Preprocedure Evaluation (Signed)
Anesthesia Evaluation  Patient identified by MRN, date of birth, ID band Patient awake    Reviewed: NPO status   History of Anesthesia Complications (+) PONV and history of anesthetic complications  Airway Mallampati: II  TM Distance: >3 FB Neck ROM: full    Dental no notable dental hx.    Pulmonary neg pulmonary ROS,    Pulmonary exam normal        Cardiovascular Exercise Tolerance: Good negative cardio ROS Normal cardiovascular exam     Neuro/Psych negative neurological ROS  negative psych ROS   GI/Hepatic negative GI ROS, Neg liver ROS,   Endo/Other  Morbid obesity (bmi 31)  Renal/GU negative Renal ROS  negative genitourinary   Musculoskeletal gout   Abdominal   Peds  Hematology negative hematology ROS (+)   Anesthesia Other Findings Covid: NEG.  Reproductive/Obstetrics                             Anesthesia Physical Anesthesia Plan  ASA: II  Anesthesia Plan: General   Post-op Pain Management:    Induction:   PONV Risk Score and Plan: 1 and TIVA  Airway Management Planned:   Additional Equipment:   Intra-op Plan:   Post-operative Plan:   Informed Consent: I have reviewed the patients History and Physical, chart, labs and discussed the procedure including the risks, benefits and alternatives for the proposed anesthesia with the patient or authorized representative who has indicated his/her understanding and acceptance.       Plan Discussed with: CRNA  Anesthesia Plan Comments:         Anesthesia Quick Evaluation

## 2018-12-07 NOTE — H&P (Signed)
Troy Lame, MD [address] [place], [place] [address] Phone: [phone] Fax : [phone]  Primary Care Physician:  Matthew Saras, DO Primary Gastroenterologist:  Dr. Allen Norris  Pre-Procedure History & Physical: HPI:  Troy Solomon is a 48 y.o. male is here for a screening colonoscopy.   Past Medical History:  Diagnosis Date  . Cough   . Gout   . Lumbago   . PONV (postoperative nausea and vomiting)   . URI (upper respiratory infection)   . Wears contact lenses     Past Surgical History:  Procedure Laterality Date  . CATARACT EXTRACTION W/PHACO Right 07/17/2018   Procedure: CATARACT EXTRACTION PHACO AND INTRAOCULAR LENS PLACEMENT ([place]) RIGHT  PANOPTIC LENS;  Surgeon: Birder Robson, MD;  Location: [facility];  Service: Ophthalmology;  Laterality: Right;  . CATARACT EXTRACTION W/PHACO Left 08/14/2018   Procedure: CATARACT EXTRACTION PHACO AND INTRAOCULAR LENS PLACEMENT ([place])  LEFT PANOPTIX LENS;  Surgeon: Birder Robson, MD;  Location: [facility];  Service: Ophthalmology;  Laterality: Left;  . FACIAL RECONSTRUCTION SURGERY    . SHOULDER SURGERY      Prior to Admission medications   Medication Sig Start Date End Date Taking? Authorizing Provider  indomethacin (INDOCIN) 50 MG capsule 50 mg 3 times daily; initiate within 24 to 48 hours of flare onset preferably; discontinue 2 to 3 days after resolution of clinical signs Patient not taking: Reported on 07/17/2018 10/28/17   Coral Spikes, DO  sertraline (ZOLOFT) 50 MG tablet Take 50 mg by mouth at bedtime.    [provider]    Allergies as of 11/07/2018  . (No Known Allergies)    Family History  Problem Relation Age of Onset  . Dementia Maternal Grandfather   . Colon cancer Maternal Grandfather   . Gout Father   . Heart failure Paternal Grandfather   . Heart disease Mother   . Allergies Mother     Social History   Socioeconomic History  . Marital status: Married     Spouse name: Not on file  . Number of children: 3  . Years of education: Not on file  . Highest education level: Not on file  Occupational History  . Occupation: Licensed conveyancer [employer]  . Financial resource strain: Not on file  . Food insecurity    Worry: Not on file    Inability: Not on file  . Transportation needs    Medical: Not on file    Non-medical: Not on file  Tobacco Use  . Smoking status: Never Smoker  . Smokeless tobacco: Never Used  Substance and Sexual Activity  . Alcohol use: No  . Drug use: No  . Sexual activity: Not on file  Lifestyle  . Physical activity    Days per week: Not on file    Minutes per session: Not on file  . Stress: Not on file  Relationships  . Social Herbalist on phone: Not on file    Gets together: Not on file    Attends religious service: Not on file    Active member of club or organization: Not on file    Attends meetings of clubs or organizations: Not on file    Relationship status: Not on file  . Intimate partner violence    Fear of current or ex partner: Not on file    Emotionally abused: Not on file    Physically abused: Not on file  Forced sexual activity: Not on file  Other Topics Concern  . Not on file  Social History Narrative  . Not on file    Review of Systems: See HPI, otherwise negative ROS  Physical Exam: BP 122/74   Pulse 92   Temp 98.4 F (36.9 C) (Temporal)   Ht 5\' 10"  (1.778 m)   Wt 96.6 kg   SpO2 99%   BMI 30.56 kg/m  General:   Alert,  pleasant and cooperative in NAD Head:  Normocephalic and atraumatic. Neck:  Supple; no masses or thyromegaly. Lungs:  Clear throughout to auscultation.    Heart:  Regular rate and rhythm. Abdomen:  Soft, nontender and nondistended. Normal bowel sounds, without guarding, and without rebound.   Neurologic:  Alert and  oriented x4;  grossly normal neurologically.  Impression/Plan: DERMAINE CHABRA is now here to undergo a screening  colonoscopy.  Risks, benefits, and alternatives regarding colonoscopy have been reviewed with the patient.  Questions have been answered.  All parties agreeable.

## 2018-12-07 NOTE — Op Note (Signed)
[facility] Gastroenterology Patient Name: Troy Solomon Procedure Date: 12/07/2018 7:54 AM MRN: [id number] Account #: [id number] Date of Birth: 06/06/1970 Admit Type: Outpatient Age: 48 Room: [facility] OR ROOM 01 Gender: Male Note Status: Finalized Procedure:            Colonoscopy Indications:          Screening for colorectal malignant neoplasm Providers:            [name] MD, MD Medicines:            Propofol per Anesthesia Complications:        No immediate complications. Procedure:            Pre-Anesthesia Assessment:                       - Prior to the procedure, a History and Physical was                        performed, and patient medications and allergies were                        reviewed. The patient's tolerance of previous                        anesthesia was also reviewed. The risks and benefits of                        the procedure and the sedation options and risks were                        discussed with the patient. All questions were                        answered, and informed consent was obtained. Prior                        Anticoagulants: The patient has taken no previous                        anticoagulant or antiplatelet agents. ASA Grade                        Assessment: II - A patient with mild systemic disease.                        After reviewing the risks and benefits, the patient was                        deemed in satisfactory condition to undergo the                        procedure.                       After obtaining informed consent, the colonoscope was                        passed under direct vision. Throughout the procedure,                        the patient's blood pressure,  pulse, and oxygen                        saturations were monitored continuously. The was                        introduced through the anus and advanced to the the                        cecum, identified by appendiceal orifice  and ileocecal                        valve. The colonoscopy was performed without                        difficulty. The patient tolerated the procedure well.                        The quality of the bowel preparation was excellent. Findings:      The perianal and digital rectal examinations were normal.      A 10 mm polyp was found in the cecum. The polyp was sessile. Area was       successfully injected with 4 mL saline with indigo carmine for a lift       polypectomy. The polyp was removed with a hot snare. Resection and       retrieval were complete. To prevent bleeding post-intervention, two       hemostatic clips were successfully placed (MR conditional). There was no       bleeding at the end of the procedure.      Two sessile polyps were found in the cecum. The polyps were 3 to 4 mm in       size. These polyps were removed with a cold snare. Resection and       retrieval were complete.      Two sessile polyps were found in the ascending colon. The polyps were 3       to 4 mm in size. These polyps were removed with a cold biopsy forceps.       Resection and retrieval were complete. Impression:           - One 10 mm polyp in the cecum, removed with a hot                        snare. Resected and retrieved. Injected. Clips (MR                        conditional) were placed.                       - Two 3 to 4 mm polyps in the cecum, removed with a                        cold snare. Resected and retrieved.                       - Two 3 to 4 mm polyps in the ascending colon, removed                        with a cold biopsy forceps. Resected and retrieved.  Recommendation:       - Discharge patient to home.                       - Resume previous diet.                       - Continue present medications.                       - Await pathology results.                       - Repeat colonoscopy in 3 years if polyp adenoma and 10                        years if  hyperplastic Procedure Code(s):    --- Professional ---                       [phone], Colonoscopy, flexible; with removal of tumor(s),                        polyp(s), or other lesion(s) by snare technique                       [id number], Colonoscopy, flexible; with directed submucosal                        injection(s), any substance                       [id number], 59, Colonoscopy, flexible; with biopsy, single                        or multiple Diagnosis Code(s):    --- Professional ---                       Z12.11, Encounter for screening for malignant neoplasm                        of colon                       K63.5, Polyp of colon CPT copyright 2019 American Medical Association. All rights reserved. The codes documented in this report are preliminary and upon coder review may  be revised to meet current compliance requirements. [name] MD, MD 12/07/2018 8:35:22 AM This report has been signed electronically. Number of Addenda: 0 Note Initiated On: 12/07/2018 7:54 AM Scope Withdrawal Time: 0 hours 17 minutes 53 seconds  Total Procedure Duration: 0 hours 20 minutes 50 seconds  Estimated Blood Loss: Estimated blood loss: none.      [facility]

## 2018-12-07 NOTE — Anesthesia Procedure Notes (Signed)
Date/Time: 12/07/2018 8:04 AM Performed by: Cameron Ali, CRNA Pre-anesthesia Checklist: Patient identified, Emergency Drugs available, Suction available, Timeout performed and Patient being monitored Patient Re-evaluated:Patient Re-evaluated prior to induction Oxygen Delivery Method: Nasal cannula Placement Confirmation: positive ETCO2

## 2018-12-07 NOTE — Transfer of Care (Signed)
Immediate Anesthesia Transfer of Care Note  Patient: Troy Solomon  Procedure(s) Performed: COLONOSCOPY WITH BIOPSY (N/A ) POLYPECTOMY (N/A Rectum)  Patient Location: PACU  Anesthesia Type: General  Level of Consciousness: awake, alert  and patient cooperative  Airway and Oxygen Therapy: Patient Spontanous Breathing and Patient connected to supplemental oxygen  Post-op Assessment: Post-op Vital signs reviewed, Patient's Cardiovascular Status Stable, Respiratory Function Stable, Patent Airway and No signs of Nausea or vomiting  Post-op Vital Signs: Reviewed and stable  Complications: No apparent anesthesia complications

## 2018-12-10 ENCOUNTER — Encounter: Payer: Self-pay | Admitting: Gastroenterology

## 2018-12-11 ENCOUNTER — Encounter: Payer: Self-pay | Admitting: Gastroenterology

## 2019-04-25 DIAGNOSIS — M7730 Calcaneal spur, unspecified foot: Secondary | ICD-10-CM | POA: Insufficient documentation

## 2019-04-25 DIAGNOSIS — M7660 Achilles tendinitis, unspecified leg: Secondary | ICD-10-CM | POA: Insufficient documentation

## 2019-09-02 DIAGNOSIS — F5101 Primary insomnia: Secondary | ICD-10-CM | POA: Insufficient documentation

## 2019-09-02 DIAGNOSIS — E66811 Obesity, class 1: Secondary | ICD-10-CM | POA: Insufficient documentation

## 2019-10-16 ENCOUNTER — Other Ambulatory Visit: Payer: Self-pay

## 2019-10-16 ENCOUNTER — Other Ambulatory Visit: Payer: Self-pay | Admitting: Radiology

## 2019-10-16 DIAGNOSIS — Z20822 Contact with and (suspected) exposure to covid-19: Secondary | ICD-10-CM

## 2019-10-18 LAB — NOVEL CORONAVIRUS, NAA: SARS-CoV-2, NAA: NOT DETECTED

## 2019-10-18 LAB — SARS-COV-2, NAA 2 DAY TAT

## 2019-10-21 ENCOUNTER — Ambulatory Visit: Admit: 2019-10-21 | Disposition: A | Discharge status: Home / Self Care | Payer: Managed Care, Other (non HMO)

## 2019-10-28 ENCOUNTER — Emergency Department: Payer: Managed Care, Other (non HMO)

## 2019-10-28 ENCOUNTER — Encounter: Payer: Self-pay | Admitting: Emergency Medicine

## 2019-10-28 ENCOUNTER — Other Ambulatory Visit: Payer: Self-pay

## 2019-10-28 ENCOUNTER — Emergency Department
Admission: EM | Admit: 2019-10-28 | Discharge: 2019-10-28 | Disposition: A | Discharge status: Home / Self Care | Payer: Managed Care, Other (non HMO) | Attending: Emergency Medicine | Admitting: Emergency Medicine

## 2019-10-28 DIAGNOSIS — N200 Calculus of kidney: Secondary | ICD-10-CM | POA: Insufficient documentation

## 2019-10-28 DIAGNOSIS — R112 Nausea with vomiting, unspecified: Secondary | ICD-10-CM | POA: Diagnosis not present

## 2019-10-28 DIAGNOSIS — R109 Unspecified abdominal pain: Secondary | ICD-10-CM | POA: Diagnosis present

## 2019-10-28 DIAGNOSIS — I10 Essential (primary) hypertension: Secondary | ICD-10-CM | POA: Diagnosis not present

## 2019-10-28 LAB — COMPREHENSIVE METABOLIC PANEL
ALT: 51 U/L — ABNORMAL HIGH (ref 0–44)
AST: 37 U/L (ref 15–41)
Albumin: 4.5 g/dL (ref 3.5–5.0)
Alkaline Phosphatase: 91 U/L (ref 38–126)
Anion gap: 10 (ref 5–15)
BUN: 16 mg/dL (ref 6–20)
CO2: 25 mmol/L (ref 22–32)
Calcium: 9.2 mg/dL (ref 8.9–10.3)
Chloride: 103 mmol/L (ref 98–111)
Creatinine, Ser: 1.27 mg/dL — ABNORMAL HIGH (ref 0.61–1.24)
GFR calc Af Amer: >60 mL/min (ref >60)
GFR calc non Af Amer: >60 mL/min (ref >60)
Glucose, Bld: 134 mg/dL — ABNORMAL HIGH (ref 70–99)
Potassium: 3.9 mmol/L (ref 3.5–5.1)
Sodium: 138 mmol/L (ref 135–145)
Total Bilirubin: 0.8 mg/dL (ref 0.3–1.2)
Total Protein: 7.8 g/dL (ref 6.5–8.1)

## 2019-10-28 LAB — CBC
HCT: 44 % (ref 39.0–52.0)
Hemoglobin: 15 g/dL (ref 13.0–17.0)
MCH: 29.3 pg (ref 26.0–34.0)
MCHC: 34.1 g/dL (ref 30.0–36.0)
MCV: 85.9 fL (ref 80.0–100.0)
Platelets: 249 10*3/uL (ref 150–400)
RBC: 5.12 MIL/uL (ref 4.22–5.81)
RDW: 12.9 % (ref 11.5–15.5)
WBC: 14.1 10*3/uL — ABNORMAL HIGH (ref 4.0–10.5)
nRBC: 0 % (ref 0.0–0.2)

## 2019-10-28 LAB — LIPASE, BLOOD: Lipase: 33 U/L (ref 11–51)

## 2019-10-28 LAB — URINALYSIS, COMPLETE (UACMP) WITH MICROSCOPIC
Bacteria, UA: NONE SEEN
Bilirubin Urine: NEGATIVE
Glucose, UA: NEGATIVE mg/dL
Hgb urine dipstick: NEGATIVE
Ketones, ur: NEGATIVE mg/dL
Leukocytes,Ua: NEGATIVE
Nitrite: NEGATIVE
Protein, ur: NEGATIVE mg/dL
Specific Gravity, Urine: 1.013 (ref 1.005–1.030)
pH: 6 (ref 5.0–8.0)

## 2019-10-28 MED ORDER — ONDANSETRON HCL 4 MG/2ML IJ SOLN
4.0000 mg | Freq: Once | INTRAMUSCULAR | Status: AC
  Administered 2019-10-28: 4 mg via INTRAVENOUS
  Filled 2019-10-28: qty 2

## 2019-10-28 MED ORDER — FENTANYL CITRATE (PF) 100 MCG/2ML IJ SOLN
50.0000 ug | INTRAMUSCULAR | Status: DC | PRN
  Administered 2019-10-28: 50 ug via INTRAVENOUS
  Filled 2019-10-28: qty 2

## 2019-10-28 MED ORDER — KETOROLAC TROMETHAMINE 30 MG/ML IJ SOLN
30.0000 mg | Freq: Once | INTRAMUSCULAR | Status: AC
  Administered 2019-10-28: 30 mg via INTRAVENOUS
  Filled 2019-10-28: qty 1

## 2019-10-28 MED ORDER — ONDANSETRON HCL 4 MG PO TABS: 4.0000 mg | ORAL_TABLET | Freq: Three times a day (TID) | ORAL | 0 refills | Status: DC | PRN

## 2019-10-28 MED ORDER — HYDROMORPHONE HCL 1 MG/ML IJ SOLN
INTRAMUSCULAR | Status: AC
  Filled 2019-10-28: qty 1

## 2019-10-28 MED ORDER — OXYCODONE-ACETAMINOPHEN 5-325 MG PO TABS: 1.0000 | ORAL_TABLET | Freq: Three times a day (TID) | ORAL | 0 refills | Status: DC | PRN

## 2019-10-28 MED ORDER — HYDROMORPHONE HCL 1 MG/ML IJ SOLN
1.0000 mg | Freq: Once | INTRAMUSCULAR | Status: AC
  Administered 2019-10-28: 1 mg via INTRAVENOUS

## 2019-10-28 MED ORDER — OXYCODONE-ACETAMINOPHEN 5-325 MG PO TABS: 1.0000 | ORAL_TABLET | Freq: Once | ORAL | Status: DC

## 2019-10-28 MED ORDER — TAMSULOSIN HCL 0.4 MG PO CAPS: 0.4000 mg | ORAL_CAPSULE | Freq: Every day | ORAL | 0 refills | Status: AC

## 2019-10-28 NOTE — ED Provider Notes (Signed)
[facility] Emergency Department Provider Note  ____________________________________________   First MD Initiated Contact with Patient 10/28/19 1745     (approximate)  I have reviewed the triage vital signs and the nursing notes.   HISTORY  Chief Complaint Abdominal Pain   HPI Troy Solomon is a 49 y.o. male with a past medical history of gout, tobacco, and remote history of kidney stents who presents for assessment of acute onset of right-sided flank pain that began earlier today.  This was associated with nonbloody nonbilious vomiting and nausea.  No recent fevers, chills, cough, shortness of breath, chest pain, left-sided abdominal pain or back pain, dysuria, burning with urination, blood in the urine, diarrhea, blood in stool, rash, extremity pain, other acute complaints.  No clearly getting aggravating factors.  This is similar to kidney pain he has had several decades ago.  No other acute concerns at this time.         Past Medical History:  Diagnosis Date  . Cough   . Gout   . Lumbago   . PONV (postoperative nausea and vomiting)   . URI (upper respiratory infection)   . Wears contact lenses     Patient Active Problem List   Diagnosis Date Noted  . Special screening for malignant neoplasms, colon   . Polyp of ascending colon   . Cough     Past Surgical History:  Procedure Laterality Date  . CATARACT EXTRACTION W/PHACO Right 07/17/2018   Procedure: CATARACT EXTRACTION PHACO AND INTRAOCULAR LENS PLACEMENT ([place]) RIGHT  PANOPTIC LENS;  Surgeon: Birder Robson, MD;  Location: [place];  Service: Ophthalmology;  Laterality: Right;  . CATARACT EXTRACTION W/PHACO Left 08/14/2018   Procedure: CATARACT EXTRACTION PHACO AND INTRAOCULAR LENS PLACEMENT ([place])  LEFT PANOPTIX LENS;  Surgeon: Birder Robson, MD;  Location: [place];  Service: Ophthalmology;  Laterality: Left;  . COLONOSCOPY WITH PROPOFOL N/A 12/07/2018    Procedure: COLONOSCOPY WITH BIOPSY;  Surgeon: [name], MD;  Location: [place];  Service: Endoscopy;  Laterality: N/A;  Placed clip x2 at Cecal Polyp removal site   . FACIAL RECONSTRUCTION SURGERY    . POLYPECTOMY N/A 12/07/2018   Procedure: POLYPECTOMY;  Surgeon: [name], MD;  Location: [place];  Service: Endoscopy;  Laterality: N/A;  . SHOULDER SURGERY      Prior to Admission medications   Medication Sig Start Date End Date Taking? Authorizing Provider  indomethacin (INDOCIN) 50 MG capsule 50 mg 3 times daily; initiate within 24 to 48 hours of flare onset preferably; discontinue 2 to 3 days after resolution of clinical signs Patient not taking: Reported on 07/17/2018 10/28/17   Coral Spikes, DO  ondansetron (ZOFRAN) 4 MG tablet Take 1 tablet (4 mg total) by mouth every 8 (eight) hours as needed for up to 10 doses for nausea or vomiting. 10/28/19   Lucrezia Starch, MD  oxyCODONE-acetaminophen (PERCOCET) 5-325 MG tablet Take 1 tablet by mouth every 8 (eight) hours as needed for up to 5 days for severe pain. 10/28/19 11/02/19  Lucrezia Starch, MD  sertraline (ZOLOFT) 50 MG tablet Take 50 mg by mouth at bedtime.    [provider]  tamsulosin (FLOMAX) 0.4 MG CAPS capsule Take 1 capsule (0.4 mg total) by mouth daily for 5 days. 10/28/19 11/02/19  Lucrezia Starch, MD    Allergies Patient has no known allergies.  Family History  Problem Relation Age of Onset  . Dementia Maternal Grandfather   .  Colon cancer Maternal Grandfather   . Gout Father   . Heart failure Paternal Grandfather   . Heart disease Mother   . Allergies Mother     Social History Social History   Tobacco Use  . Smoking status: Never Smoker  . Smokeless tobacco: Never Used  Vaping Use  . Vaping Use: Never used  Substance Use Topics  . Alcohol use: No  . Drug use: No    Review of Systems  Review of Systems  Constitutional: Negative for chills and fever.  HENT: Negative for  sore throat.   Eyes: Negative for pain.  Respiratory: Negative for cough and stridor.   Cardiovascular: Negative for chest pain.  Gastrointestinal: Positive for nausea and vomiting.  Genitourinary: Positive for flank pain.  Skin: Negative for rash.  Neurological: Negative for seizures, loss of consciousness and headaches.  Psychiatric/Behavioral: Negative for suicidal ideas.  All other systems reviewed and are negative.     ____________________________________________   PHYSICAL EXAM:  VITAL SIGNS: ED Triage Vitals  Enc Vitals Group     BP 10/28/19 1427 (!) 172/94     Pulse Rate 10/28/19 1426 60     Resp 10/28/19 1426 20     Temp 10/28/19 1426 97.7 F (36.5 C)     Temp Source 10/28/19 1426 Oral     SpO2 10/28/19 1426 100 %     Weight 10/28/19 1426 210 lb (95.3 kg)     Height 10/28/19 1426 5\' 10"  (1.778 m)     Head Circumference --      Peak Flow --      Pain Score 10/28/19 1426 10     Pain Loc --      Pain Edu? --      Excl. in [place]? --    Vitals:   10/28/19 1426 10/28/19 1427  BP:  (!) 172/94  Pulse: 60   Resp: 20   Temp: 97.7 F (36.5 C)   SpO2: 100%    Physical Exam Vitals and nursing note reviewed.  Constitutional:      Appearance: He is well-developed.  HENT:     Head: Normocephalic and atraumatic.     Right Ear: External ear normal.     Left Ear: External ear normal.     Nose: Nose normal.  Eyes:     Conjunctiva/sclera: Conjunctivae normal.  Cardiovascular:     Rate and Rhythm: Normal rate and regular rhythm.     Heart sounds: No murmur heard.   Pulmonary:     Effort: Pulmonary effort is normal. No respiratory distress.     Breath sounds: Normal breath sounds.  Abdominal:     Palpations: Abdomen is soft.     Tenderness: There is no abdominal tenderness. There is no right CVA tenderness or left CVA tenderness.  Musculoskeletal:     Cervical back: Neck supple.  Skin:    General: Skin is warm and dry.  Neurological:     Mental Status: He is  alert and oriented to person, place, and time.  Psychiatric:        Mood and Affect: Mood normal.      ____________________________________________   LABS (all labs ordered are listed, but only abnormal results are displayed)  Labs Reviewed  COMPREHENSIVE METABOLIC PANEL - Abnormal; Notable for the following components:      Result Value   Glucose, Bld 134 (*)    Creatinine, Ser 1.27 (*)    ALT 51 (*)    All other components  within normal limits  CBC - Abnormal; Notable for the following components:   WBC 14.1 (*)    All other components within normal limits  URINALYSIS, COMPLETE (UACMP) WITH MICROSCOPIC - Abnormal; Notable for the following components:   Color, Urine YELLOW (*)    APPearance CLEAR (*)    All other components within normal limits  LIPASE, BLOOD   ____________________________________________  ____________________________________________  RADIOLOGY  ED MD interpretation: CT reviewed by myself with thickening discoloration during all medical decision making.  Official radiology report(s): CT Renal Stone Study  Result Date: 10/28/2019 CLINICAL DATA:  Acute right flank pain. Remote history of a kidney stone. EXAM: CT ABDOMEN AND PELVIS WITHOUT CONTRAST TECHNIQUE: Multidetector CT imaging of the abdomen and pelvis was performed following the standard protocol without IV contrast. COMPARISON:  None. FINDINGS: Lower chest: Clear lung bases.  Heart is normal in size. Hepatobiliary: No focal liver abnormality is seen. No gallstones, gallbladder wall thickening, or biliary dilatation. Pancreas: Unremarkable. No pancreatic ductal dilatation or surrounding inflammatory changes. Spleen: Normal in size without focal abnormality. Adrenals/Urinary Tract: No adrenal masses. Kidneys normal in size, orientation and position. Mild right hydronephrosis, due to a 6 mm stone that lies just below the ureteropelvic junction. Right ureter below this is normal in course and in caliber with  no additional stones. No left hydronephrosis. Normal left ureter. There are multiple bilateral nonobstructing intrarenal stones. No renal masses. Mild right perinephric stranding. Bladder is decompressed, otherwise unremarkable. Stomach/Bowel: Stomach is within normal limits. Appendix appears normal. No evidence of bowel wall thickening, distention, or inflammatory changes. Vascular/Lymphatic: Minor aortic atherosclerotic calcifications. No aneurysm. No enlarged lymph nodes. Reproductive: Unremarkable. Other: No abdominal wall hernia or abnormality. No abdominopelvic ascites. Musculoskeletal: No fracture or acute finding. No osteoblastic or osteolytic lesions. IMPRESSION: 1. 6 mm stone in the proximal right ureter just below the ureteropelvic junction. This causes mild right hydronephrosis and right perinephric stranding. 2. No other acute abnormality within the abdomen or pelvis. 3. Multiple bilateral nonobstructing intrarenal stones. 4. Minor aortic atherosclerosis. Electronically Signed   By: Lajean Manes M.D.   On: 10/28/2019 15:08    ____________________________________________   PROCEDURES  Procedure(s) performed (including Critical Care):  Procedures   ____________________________________________   INITIAL IMPRESSION / ASSESSMENT AND PLAN / ED COURSE        Several patient's history, exam, and ED work-up is most consistent with right-sided flank pain associate with nausea and vomiting likely secondary to right-sided 6 mm kidney stone in the right proximal ureter with some mild hydro and and perinephric stranding.  No CVA tenderness, fever, and UA does not appear UA does not appear infected.  Kidney function is within normal limits.  Lipase is not consistent with acute pancreatitis there is no findings on exam or hepatic function panel to suggest acute cholestatic process or acute cholecystitis.  Low suspicion for acute pancreatitis, diverticulitis, or other immediate life-threatening  abdominal pathology at this time.  Patient given below noted analgesia and antiemetic.  Rx written for Percocet Zofran and Flomax.  Will have patient follow-up with urology.  Patient discharged in condition.  Return precautions advised and discussed.  Also instructed patient in writing his blood pressure was elevated today and he should have this rechecked by his PCP. ______________________________   FINAL CLINICAL IMPRESSION(S) / ED DIAGNOSES  Final diagnoses:  Kidney stone  Hypertension, unspecified type    Medications  fentaNYL (SUBLIMAZE) injection 50 mcg (50 mcg Intravenous Given 10/28/19 1434)  oxyCODONE-acetaminophen (PERCOCET/ROXICET) 5-325 MG per  tablet 1 tablet (has no administration in time range)  ondansetron (ZOFRAN) injection 4 mg (4 mg Intravenous Given 10/28/19 1434)  HYDROmorphone (DILAUDID) injection 1 mg (1 mg Intravenous Given 10/28/19 1458)  ketorolac (TORADOL) 30 MG/ML injection 30 mg (30 mg Intravenous Given 10/28/19 1753)     ED Discharge Orders         Ordered    ondansetron (ZOFRAN) 4 MG tablet  Every 8 hours PRN        10/28/19 1755    oxyCODONE-acetaminophen (PERCOCET) 5-325 MG tablet  Every 8 hours PRN        10/28/19 1755    tamsulosin (FLOMAX) 0.4 MG CAPS capsule  Daily        10/28/19 1755           Note:  This document was prepared using Dragon voice recognition software and may include unintentional dictation errors.   Lucrezia Starch, MD 10/28/19 1800

## 2019-10-28 NOTE — ED Triage Notes (Signed)
Pt started acutely on way back from beach with right side pain.  Vomiting from pain.  No fever. Hx of kidney stone but been 30 years and unsure if similar. Appears in significant pain.

## 2019-11-01 ENCOUNTER — Encounter: Payer: Self-pay | Admitting: Urology

## 2019-11-01 ENCOUNTER — Ambulatory Visit: Payer: Managed Care, Other (non HMO) | Admitting: Urology

## 2019-11-01 ENCOUNTER — Other Ambulatory Visit: Payer: Self-pay

## 2019-11-01 VITALS — BP 138/80 | HR 77 | Ht 70.0 in | Wt 205.0 lb

## 2019-11-01 DIAGNOSIS — N132 Hydronephrosis with renal and ureteral calculous obstruction: Secondary | ICD-10-CM

## 2019-11-01 DIAGNOSIS — N201 Calculus of ureter: Secondary | ICD-10-CM | POA: Diagnosis not present

## 2019-11-01 DIAGNOSIS — N200 Calculus of kidney: Secondary | ICD-10-CM | POA: Diagnosis not present

## 2019-11-01 DIAGNOSIS — N23 Unspecified renal colic: Secondary | ICD-10-CM | POA: Diagnosis not present

## 2019-11-01 LAB — URINALYSIS, COMPLETE
Bilirubin, UA: NEGATIVE
Glucose, UA: NEGATIVE
Ketones, UA: NEGATIVE
Leukocytes,UA: NEGATIVE
Nitrite, UA: NEGATIVE
Protein,UA: NEGATIVE
Specific Gravity, UA: 1.02 (ref 1.005–1.030)
Urobilinogen, Ur: 0.2 mg/dL (ref 0.2–1.0)
pH, UA: 5.5 (ref 5.0–7.5)

## 2019-11-01 LAB — MICROSCOPIC EXAMINATION

## 2019-11-01 MED ORDER — OXYCODONE-ACETAMINOPHEN 5-325 MG PO TABS: 1.0000 | ORAL_TABLET | Freq: Three times a day (TID) | ORAL | 0 refills | Status: DC | PRN

## 2019-11-01 NOTE — H&P (View-Only) (Signed)
11/01/2019 11:35 AM   Troy Solomon 02/16/1971 [id number]  Referring provider: Matthew Saras, DO [address] [place],  [place] [address]  Chief Complaint  Patient presents with  . Nephrolithiasis    HPI: Troy Solomon is a 49 y.o. male who presents for follow-up of a recent ED visit for renal colic.   Presented to [facility] ED 10/28/2019 on 10/28/2019 with a several hour history of acute right flank pain associated with nausea and vomiting  Radiation of pain to right lower quadrant  Pain described as severe without identifiable precipitating, aggravating or alleviating factors  Denied fever, chills  Stone protocol CT remarkable for bilateral, nonobstructing renal calculi and a 6 mm right proximal ureteral calculus with mild hydronephrosis  Symptoms controlled with parenteral analgesics and discharged on oxycodone, tamsulosin and Zofran  Pain has been controlled with oral narcotic analgesic however is taking regularly  Prior episode renal colic approximately 30 years ago and able to pass stone  Stone density 1100 HU; SSD 14 cm   PMH: Past Medical History:  Diagnosis Date  . Cough   . Gout   . Lumbago   . PONV (postoperative nausea and vomiting)   . URI (upper respiratory infection)   . Wears contact lenses     Surgical History: Past Surgical History:  Procedure Laterality Date  . CATARACT EXTRACTION W/PHACO Right 07/17/2018   Procedure: CATARACT EXTRACTION PHACO AND INTRAOCULAR LENS PLACEMENT ([place]) RIGHT  PANOPTIC LENS;  Surgeon: Birder Robson, MD;  Location: [place];  Service: Ophthalmology;  Laterality: Right;  . CATARACT EXTRACTION W/PHACO Left 08/14/2018   Procedure: CATARACT EXTRACTION PHACO AND INTRAOCULAR LENS PLACEMENT ([place])  LEFT PANOPTIX LENS;  Surgeon: Birder Robson, MD;  Location: [place];  Service: Ophthalmology;  Laterality: Left;  . COLONOSCOPY WITH PROPOFOL N/A 12/07/2018   Procedure: COLONOSCOPY WITH BIOPSY;   Surgeon: [name], MD;  Location: [place];  Service: Endoscopy;  Laterality: N/A;  Placed clip x2 at Cecal Polyp removal site   . FACIAL RECONSTRUCTION SURGERY    . POLYPECTOMY N/A 12/07/2018   Procedure: POLYPECTOMY;  Surgeon: [name], MD;  Location: [place];  Service: Endoscopy;  Laterality: N/A;  . SHOULDER SURGERY      Home Medications:  Allergies as of 11/01/2019   No Known Allergies     Medication List       Accurate as of November 01, 2019 11:35 AM. If you have any questions, ask your nurse or doctor.        indomethacin 50 MG capsule Commonly known as: INDOCIN 50 mg 3 times daily; initiate within 24 to 48 hours of flare onset preferably; discontinue 2 to 3 days after resolution of clinical signs   ondansetron 4 MG tablet Commonly known as: Zofran Take 1 tablet (4 mg total) by mouth every 8 (eight) hours as needed for up to 10 doses for nausea or vomiting.   oxyCODONE-acetaminophen 5-325 MG tablet Commonly known as: Percocet Take 1 tablet by mouth every 8 (eight) hours as needed for up to 5 days for severe pain.   sertraline 50 MG tablet Commonly known as: ZOLOFT Take 50 mg by mouth at bedtime.   tamsulosin 0.4 MG Caps capsule Commonly known as: Flomax Take 1 capsule (0.4 mg total) by mouth daily for 5 days.       Allergies: No Known Allergies  Family History: Family History  Problem Relation Age of Onset  . Dementia Maternal Grandfather   . Colon cancer  Maternal Grandfather   . Gout Father   . Heart failure Paternal Grandfather   . Heart disease Mother   . Allergies Mother     Social History:  reports that he has never smoked. He has never used smokeless tobacco. He reports that he does not drink alcohol and does not use drugs.   Physical Exam: BP 138/80   Pulse 77   Ht 5\' 10"  (1.778 m)   Wt 205 lb (93 kg)   BMI 29.41 kg/m   Constitutional:  Alert and oriented, No acute distress. HEENT: [place] AT, moist mucus  membranes.  Trachea midline, no masses. Cardiovascular: No clubbing, cyanosis, or edema. Respiratory: Normal respiratory effort, no increased work of breathing. GI: Abdomen is soft, nontender, nondistended, no abdominal masses GU: No CVA tenderness Skin: No rashes, bruises or suspicious lesions. Neurologic: Grossly intact, no focal deficits, moving all 4 extremities. Psychiatric: Normal mood and affect.   Pertinent Imaging: CT images were personally reviewed  CT Renal Stone Study  Narrative CLINICAL DATA:  Acute right flank pain. Remote history of a kidney stone.  EXAM: CT ABDOMEN AND PELVIS WITHOUT CONTRAST  TECHNIQUE: Multidetector CT imaging of the abdomen and pelvis was performed following the standard protocol without IV contrast.  COMPARISON:  None.  FINDINGS: Lower chest: Clear lung bases.  Heart is normal in size.  Hepatobiliary: No focal liver abnormality is seen. No gallstones, gallbladder wall thickening, or biliary dilatation.  Pancreas: Unremarkable. No pancreatic ductal dilatation or surrounding inflammatory changes.  Spleen: Normal in size without focal abnormality.  Adrenals/Urinary Tract: No adrenal masses.  Kidneys normal in size, orientation and position. Mild right hydronephrosis, due to a 6 mm stone that lies just below the ureteropelvic junction. Right ureter below this is normal in course and in caliber with no additional stones. No left hydronephrosis. Normal left ureter. There are multiple bilateral nonobstructing intrarenal stones. No renal masses. Mild right perinephric stranding.  Bladder is decompressed, otherwise unremarkable.  Stomach/Bowel: Stomach is within normal limits. Appendix appears normal. No evidence of bowel wall thickening, distention, or inflammatory changes.  Vascular/Lymphatic: Minor aortic atherosclerotic calcifications. No aneurysm. No enlarged lymph nodes.  Reproductive: Unremarkable.  Other: No abdominal  wall hernia or abnormality. No abdominopelvic ascites.  Musculoskeletal: No fracture or acute finding. No osteoblastic or osteolytic lesions.  IMPRESSION: 1. 6 mm stone in the proximal right ureter just below the ureteropelvic junction. This causes mild right hydronephrosis and right perinephric stranding. 2. No other acute abnormality within the abdomen or pelvis. 3. Multiple bilateral nonobstructing intrarenal stones. 4. Minor aortic atherosclerosis.   Electronically Signed By: Lajean Manes M.D. On: 10/28/2019 15:08   Assessment & Plan:    1.  Right proximal ureteral calculus We discussed various treatment options for urolithiasis including observation with or without medical expulsive therapy, shockwave lithotripsy (SWL), ureteroscopy and laser lithotripsy with stent placement.  We discussed that management is based on stone size, location, density, patient co-morbidities, and patient preference.   Stones <59mm in size have a >80% spontaneous passage rate. Data surrounding the use of tamsulosin for medical expulsive therapy is controversial, but meta analyses suggests it is most efficacious for distal stones between 5-70mm in size. Possible side effects include dizziness/lightheadedness, and retrograde ejaculation.  SWL has a lower stone free rate in a single procedure, but also a lower complication rate compared to ureteroscopy and avoids a stent and associated stent related symptoms. Possible complications include renal hematoma, steinstrasse, and need for additional treatment.  Ureteroscopy with  laser lithotripsy and stent placement has a higher stone free rate than SWL in a single procedure, however increased complication rate including possible infection, ureteral injury, bleeding, and stent related morbidity. Common stent related symptoms include dysuria, urgency/frequency, and flank pain.   He was informed ureteroscopy would have the advantage that his right-sided renal  calculi could be cleared and would not be able to be treated with shockwave lithotripsy.  Stone density and skin to stone distance were also discussed regarding success of shockwave lithotripsy  After an extensive discussion of the risks and benefits of the above treatment options, the patient would like to proceed with shockwave lithotripsy.   Oxycodone was refilled  2.  Nephrolithiasis  Bilateral, nonobstructing renal calculi  Recommend metabolic evaluation after current episode resolves   Abbie Sons, MD  [facility] [address], [place], [place] [address] [phone]

## 2019-11-01 NOTE — Progress Notes (Signed)
11/01/2019 11:35 AM   Troy Solomon 1971-01-09 [id number]  Referring provider: Matthew Saras, DO [address] [place],  [place] [address]  Chief Complaint  Patient presents with  . Nephrolithiasis    HPI: Troy Solomon is a 49 y.o. male who presents for follow-up of a recent ED visit for renal colic.   Presented to [facility] ED 10/28/2019 on 10/28/2019 with a several hour history of acute right flank pain associated with nausea and vomiting  Radiation of pain to right lower quadrant  Pain described as severe without identifiable precipitating, aggravating or alleviating factors  Denied fever, chills  Stone protocol CT remarkable for bilateral, nonobstructing renal calculi and a 6 mm right proximal ureteral calculus with mild hydronephrosis  Symptoms controlled with parenteral analgesics and discharged on oxycodone, tamsulosin and Zofran  Pain has been controlled with oral narcotic analgesic however is taking regularly  Prior episode renal colic approximately 30 years ago and able to pass stone  Stone density 1100 HU; SSD 14 cm   PMH: Past Medical History:  Diagnosis Date  . Cough   . Gout   . Lumbago   . PONV (postoperative nausea and vomiting)   . URI (upper respiratory infection)   . Wears contact lenses     Surgical History: Past Surgical History:  Procedure Laterality Date  . CATARACT EXTRACTION W/PHACO Right 07/17/2018   Procedure: CATARACT EXTRACTION PHACO AND INTRAOCULAR LENS PLACEMENT ([place]) RIGHT  PANOPTIC LENS;  Surgeon: Birder Robson, MD;  Location: [place];  Service: Ophthalmology;  Laterality: Right;  . CATARACT EXTRACTION W/PHACO Left 08/14/2018   Procedure: CATARACT EXTRACTION PHACO AND INTRAOCULAR LENS PLACEMENT ([place])  LEFT PANOPTIX LENS;  Surgeon: Birder Robson, MD;  Location: [place];  Service: Ophthalmology;  Laterality: Left;  . COLONOSCOPY WITH PROPOFOL N/A 12/07/2018   Procedure: COLONOSCOPY WITH BIOPSY;   Surgeon: [name], MD;  Location: [place];  Service: Endoscopy;  Laterality: N/A;  Placed clip x2 at Cecal Polyp removal site   . FACIAL RECONSTRUCTION SURGERY    . POLYPECTOMY N/A 12/07/2018   Procedure: POLYPECTOMY;  Surgeon: [name], MD;  Location: [place];  Service: Endoscopy;  Laterality: N/A;  . SHOULDER SURGERY      Home Medications:  Allergies as of 11/01/2019   No Known Allergies     Medication List       Accurate as of November 01, 2019 11:35 AM. If you have any questions, ask your nurse or doctor.        indomethacin 50 MG capsule Commonly known as: INDOCIN 50 mg 3 times daily; initiate within 24 to 48 hours of flare onset preferably; discontinue 2 to 3 days after resolution of clinical signs   ondansetron 4 MG tablet Commonly known as: Zofran Take 1 tablet (4 mg total) by mouth every 8 (eight) hours as needed for up to 10 doses for nausea or vomiting.   oxyCODONE-acetaminophen 5-325 MG tablet Commonly known as: Percocet Take 1 tablet by mouth every 8 (eight) hours as needed for up to 5 days for severe pain.   sertraline 50 MG tablet Commonly known as: ZOLOFT Take 50 mg by mouth at bedtime.   tamsulosin 0.4 MG Caps capsule Commonly known as: Flomax Take 1 capsule (0.4 mg total) by mouth daily for 5 days.       Allergies: No Known Allergies  Family History: Family History  Problem Relation Age of Onset  . Dementia Maternal Grandfather   . Colon cancer  Maternal Grandfather   . Gout Father   . Heart failure Paternal Grandfather   . Heart disease Mother   . Allergies Mother     Social History:  reports that he has never smoked. He has never used smokeless tobacco. He reports that he does not drink alcohol and does not use drugs.   Physical Exam: BP 138/80   Pulse 77   Ht 5\' 10"  (1.778 m)   Wt 205 lb (93 kg)   BMI 29.41 kg/m   Constitutional:  Alert and oriented, No acute distress. HEENT: [place] AT, moist mucus  membranes.  Trachea midline, no masses. Cardiovascular: No clubbing, cyanosis, or edema. Respiratory: Normal respiratory effort, no increased work of breathing. GI: Abdomen is soft, nontender, nondistended, no abdominal masses GU: No CVA tenderness Skin: No rashes, bruises or suspicious lesions. Neurologic: Grossly intact, no focal deficits, moving all 4 extremities. Psychiatric: Normal mood and affect.   Pertinent Imaging: CT images were personally reviewed  CT Renal Stone Study  Narrative CLINICAL DATA:  Acute right flank pain. Remote history of a kidney stone.  EXAM: CT ABDOMEN AND PELVIS WITHOUT CONTRAST  TECHNIQUE: Multidetector CT imaging of the abdomen and pelvis was performed following the standard protocol without IV contrast.  COMPARISON:  None.  FINDINGS: Lower chest: Clear lung bases.  Heart is normal in size.  Hepatobiliary: No focal liver abnormality is seen. No gallstones, gallbladder wall thickening, or biliary dilatation.  Pancreas: Unremarkable. No pancreatic ductal dilatation or surrounding inflammatory changes.  Spleen: Normal in size without focal abnormality.  Adrenals/Urinary Tract: No adrenal masses.  Kidneys normal in size, orientation and position. Mild right hydronephrosis, due to a 6 mm stone that lies just below the ureteropelvic junction. Right ureter below this is normal in course and in caliber with no additional stones. No left hydronephrosis. Normal left ureter. There are multiple bilateral nonobstructing intrarenal stones. No renal masses. Mild right perinephric stranding.  Bladder is decompressed, otherwise unremarkable.  Stomach/Bowel: Stomach is within normal limits. Appendix appears normal. No evidence of bowel wall thickening, distention, or inflammatory changes.  Vascular/Lymphatic: Minor aortic atherosclerotic calcifications. No aneurysm. No enlarged lymph nodes.  Reproductive: Unremarkable.  Other: No abdominal  wall hernia or abnormality. No abdominopelvic ascites.  Musculoskeletal: No fracture or acute finding. No osteoblastic or osteolytic lesions.  IMPRESSION: 1. 6 mm stone in the proximal right ureter just below the ureteropelvic junction. This causes mild right hydronephrosis and right perinephric stranding. 2. No other acute abnormality within the abdomen or pelvis. 3. Multiple bilateral nonobstructing intrarenal stones. 4. Minor aortic atherosclerosis.   Electronically Signed By: Lajean Manes M.D. On: 10/28/2019 15:08   Assessment & Plan:    1.  Right proximal ureteral calculus We discussed various treatment options for urolithiasis including observation with or without medical expulsive therapy, shockwave lithotripsy (SWL), ureteroscopy and laser lithotripsy with stent placement.  We discussed that management is based on stone size, location, density, patient co-morbidities, and patient preference.   Stones <44mm in size have a >80% spontaneous passage rate. Data surrounding the use of tamsulosin for medical expulsive therapy is controversial, but meta analyses suggests it is most efficacious for distal stones between 5-92mm in size. Possible side effects include dizziness/lightheadedness, and retrograde ejaculation.  SWL has a lower stone free rate in a single procedure, but also a lower complication rate compared to ureteroscopy and avoids a stent and associated stent related symptoms. Possible complications include renal hematoma, steinstrasse, and need for additional treatment.  Ureteroscopy with  laser lithotripsy and stent placement has a higher stone free rate than SWL in a single procedure, however increased complication rate including possible infection, ureteral injury, bleeding, and stent related morbidity. Common stent related symptoms include dysuria, urgency/frequency, and flank pain.   He was informed ureteroscopy would have the advantage that his right-sided renal  calculi could be cleared and would not be able to be treated with shockwave lithotripsy.  Stone density and skin to stone distance were also discussed regarding success of shockwave lithotripsy  After an extensive discussion of the risks and benefits of the above treatment options, the patient would like to proceed with shockwave lithotripsy.   Oxycodone was refilled  2.  Nephrolithiasis  Bilateral, nonobstructing renal calculi  Recommend metabolic evaluation after current episode resolves   Abbie Sons, MD  [facility] [address], [place], [place] [address] [phone]

## 2019-11-03 ENCOUNTER — Encounter: Payer: Self-pay | Admitting: Urology

## 2019-11-04 ENCOUNTER — Other Ambulatory Visit: Payer: Self-pay | Admitting: *Deleted

## 2019-11-04 MED ORDER — OXYCODONE-ACETAMINOPHEN 5-325 MG PO TABS: 1.0000 | ORAL_TABLET | Freq: Three times a day (TID) | ORAL | 0 refills | Status: AC | PRN

## 2019-11-05 ENCOUNTER — Other Ambulatory Visit: Payer: Self-pay | Admitting: Radiology

## 2019-11-05 DIAGNOSIS — M10072 Idiopathic gout, left ankle and foot: Secondary | ICD-10-CM | POA: Insufficient documentation

## 2019-11-05 DIAGNOSIS — N201 Calculus of ureter: Secondary | ICD-10-CM

## 2019-11-07 ENCOUNTER — Ambulatory Visit: Payer: Managed Care, Other (non HMO)

## 2019-11-07 ENCOUNTER — Encounter
Admission: RE | Disposition: A | Discharge status: Home / Self Care | Payer: Self-pay | Source: Home / Self Care | Attending: Urology

## 2019-11-07 ENCOUNTER — Other Ambulatory Visit: Payer: Self-pay

## 2019-11-07 ENCOUNTER — Other Ambulatory Visit: Payer: Self-pay | Admitting: Urology

## 2019-11-07 ENCOUNTER — Encounter: Payer: Self-pay | Admitting: Urology

## 2019-11-07 ENCOUNTER — Ambulatory Visit
Admission: RE | Admit: 2019-11-07 | Discharge: 2019-11-07 | Disposition: A | Discharge status: Home / Self Care | Payer: Managed Care, Other (non HMO) | Attending: Urology | Admitting: Urology

## 2019-11-07 DIAGNOSIS — N132 Hydronephrosis with renal and ureteral calculous obstruction: Secondary | ICD-10-CM | POA: Diagnosis present

## 2019-11-07 DIAGNOSIS — Z79899 Other long term (current) drug therapy: Secondary | ICD-10-CM | POA: Diagnosis not present

## 2019-11-07 DIAGNOSIS — N200 Calculus of kidney: Secondary | ICD-10-CM

## 2019-11-07 DIAGNOSIS — N201 Calculus of ureter: Secondary | ICD-10-CM

## 2019-11-07 HISTORY — PX: EXTRACORPOREAL SHOCK WAVE LITHOTRIPSY: SHX1557

## 2019-11-07 SURGERY — LITHOTRIPSY, ESWL
Anesthesia: Moderate Sedation | Laterality: Right

## 2019-11-07 MED ORDER — CIPROFLOXACIN HCL 500 MG PO TABS: 500.0000 mg | ORAL_TABLET | ORAL | Status: AC

## 2019-11-07 MED ORDER — ONDANSETRON HCL 4 MG/2ML IJ SOLN
INTRAMUSCULAR | Status: AC
  Administered 2019-11-07: 4 mg via INTRAVENOUS
  Filled 2019-11-07: qty 2

## 2019-11-07 MED ORDER — ONDANSETRON HCL 4 MG/2ML IJ SOLN: 4.0000 mg | Freq: Once | INTRAMUSCULAR | Status: AC | PRN

## 2019-11-07 MED ORDER — SODIUM CHLORIDE 0.9 % IV SOLN: INTRAVENOUS | Status: DC

## 2019-11-07 MED ORDER — DIPHENHYDRAMINE HCL 25 MG PO CAPS: 25.0000 mg | ORAL_CAPSULE | ORAL | Status: AC

## 2019-11-07 MED ORDER — DIAZEPAM 5 MG PO TABS
ORAL_TABLET | ORAL | Status: AC
  Administered 2019-11-07: 10 mg via ORAL
  Filled 2019-11-07: qty 2

## 2019-11-07 MED ORDER — DIPHENHYDRAMINE HCL 25 MG PO CAPS
ORAL_CAPSULE | ORAL | Status: AC
  Administered 2019-11-07: 25 mg via ORAL
  Filled 2019-11-07: qty 1

## 2019-11-07 MED ORDER — CIPROFLOXACIN HCL 500 MG PO TABS
ORAL_TABLET | ORAL | Status: AC
  Administered 2019-11-07: 500 mg via ORAL
  Filled 2019-11-07: qty 1

## 2019-11-07 MED ORDER — TAMSULOSIN HCL 0.4 MG PO CAPS: 0.4000 mg | ORAL_CAPSULE | Freq: Every day | ORAL | 0 refills | Status: DC

## 2019-11-07 MED ORDER — DIAZEPAM 5 MG PO TABS: 10.0000 mg | ORAL_TABLET | ORAL | Status: AC

## 2019-11-07 NOTE — Discharge Instructions (Addendum)
·   As per the [facility] discharge instructions  Rx tamsulosin sent to pharmacy which will help you pass stone fragments  He will be contacted regarding follow-up appointment in 1-2 weeks   AMBULATORY SURGERY  DISCHARGE INSTRUCTIONS   1) The drugs that you were given will stay in your system until tomorrow so for the next 24 hours you should not:  A) Drive an automobile B) Make any legal decisions C) Drink any alcoholic beverage   2) You may resume regular meals tomorrow.  Today it is better to start with liquids and gradually work up to solid foods.  You may eat anything you prefer, but it is better to start with liquids, then soup and crackers, and gradually work up to solid foods.   3) Please notify your doctor immediately if you have any unusual bleeding, trouble breathing, redness and pain at the surgery site, drainage, fever, or pain not relieved by medication.    4) Additional Instructions:        Please contact your physician with any problems or Same Day Surgery at [phone], Monday through Friday 6 am to 4 pm, or [place] at [facility] number at [phone].

## 2019-11-07 NOTE — Interval H&P Note (Signed)
History and Physical Interval Note:  11/07/2019 9:11 AM  Troy Solomon  has presented today for surgery, with the diagnosis of KIDNEY STONE.  The various methods of treatment have been discussed with the patient and family. After consideration of risks, benefits and other options for treatment, the patient has consented to  Procedure(s): EXTRACORPOREAL SHOCK WAVE LITHOTRIPSY (ESWL) (Right) as a surgical intervention.  The patient's history has been reviewed, patient examined, no change in status, stable for surgery.  I have reviewed the patient's chart and labs.  Questions were answered to the patient's satisfaction.     [place]

## 2019-11-21 ENCOUNTER — Ambulatory Visit: Payer: Managed Care, Other (non HMO) | Admitting: Physician Assistant

## 2019-11-29 ENCOUNTER — Ambulatory Visit: Payer: Managed Care, Other (non HMO) | Admitting: Physician Assistant

## 2020-07-09 DIAGNOSIS — Z8739 Personal history of other diseases of the musculoskeletal system and connective tissue: Secondary | ICD-10-CM | POA: Insufficient documentation

## 2021-01-30 IMAGING — CT CT RENAL STONE PROTOCOL
2 of 4 series · 16 of 46 positions shown, 18 images · non-contrast
Comparison: None.

CLINICAL DATA: Acute right flank pain. Remote history of a kidney
stone.

EXAM:
CT ABDOMEN AND PELVIS WITHOUT CONTRAST
TECHNIQUE: Multidetector CT imaging of the abdomen and pelvis was performed
following the standard protocol without IV contrast.

[Series 2: stone full standard · axial · 0.91mm/px · z∈[-1004,-554]mm · 13 of 98 slices shown, 15 images]
[im 4/98  soft-tissue]
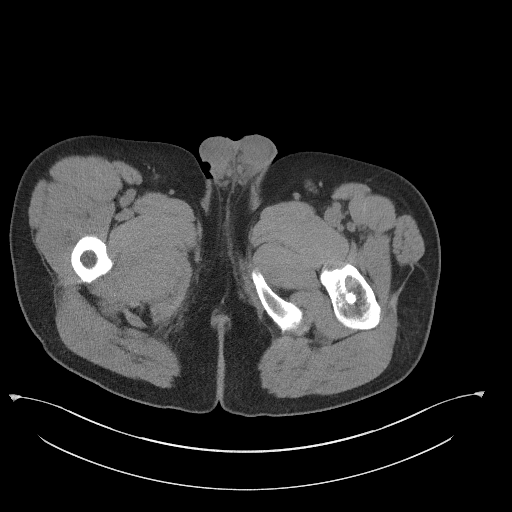
[im 4/98  bone]
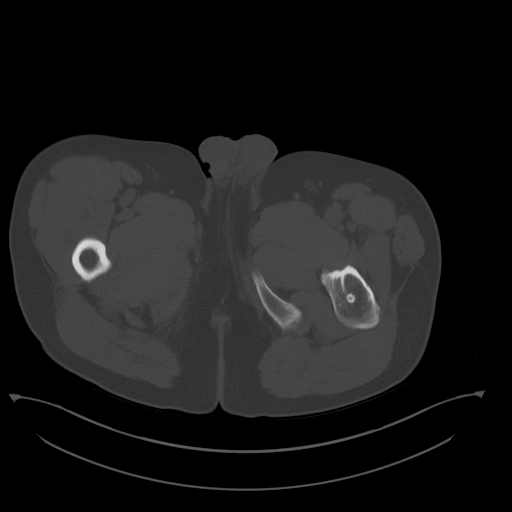
[im 12/98  soft-tissue]
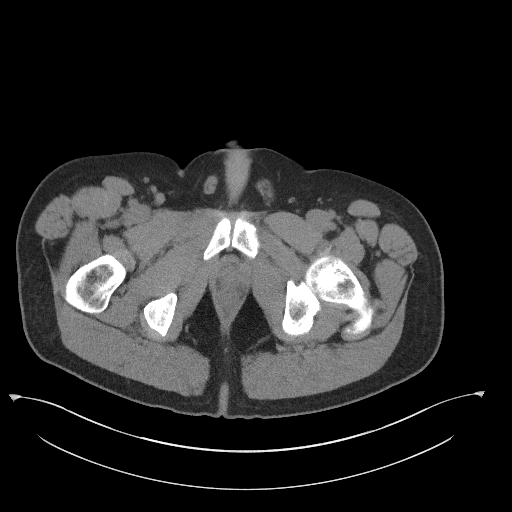
[im 20/98  soft-tissue]
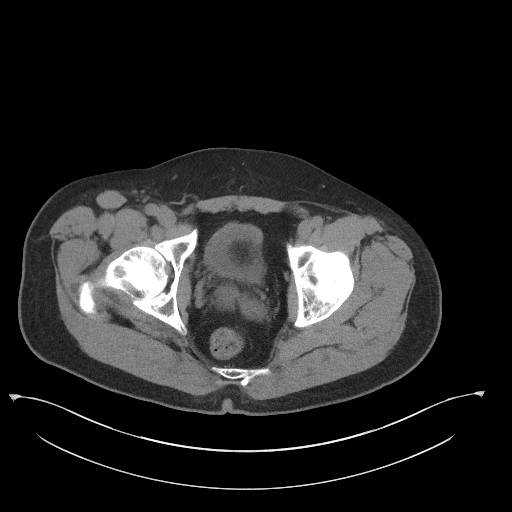
[im 28/98  soft-tissue]
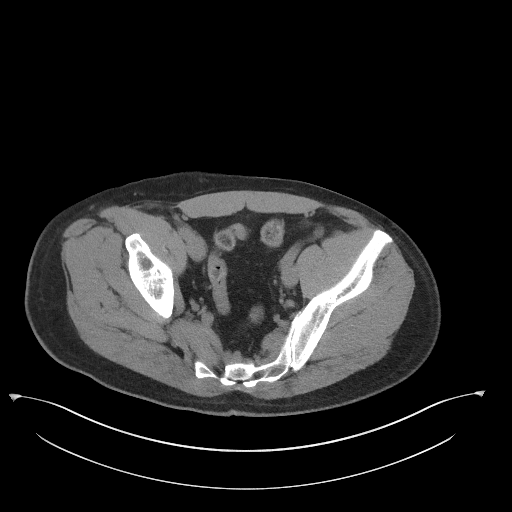
[im 35/98  soft-tissue]
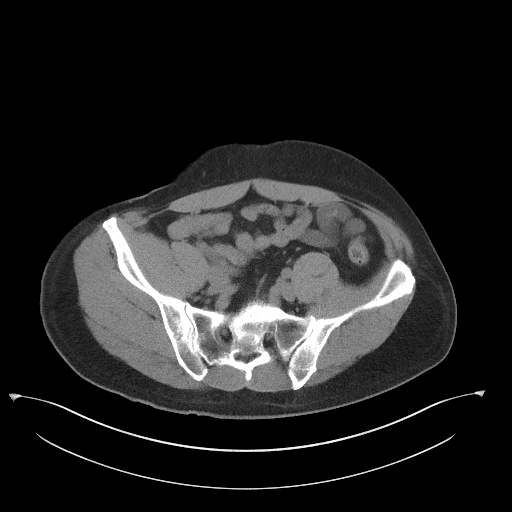
[im 43/98  soft-tissue]
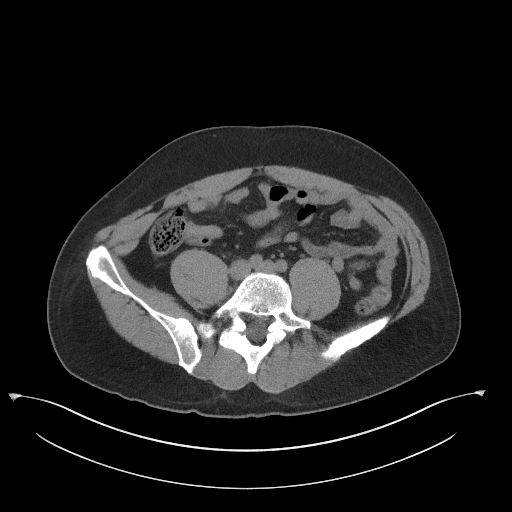
[im 51/98  soft-tissue]
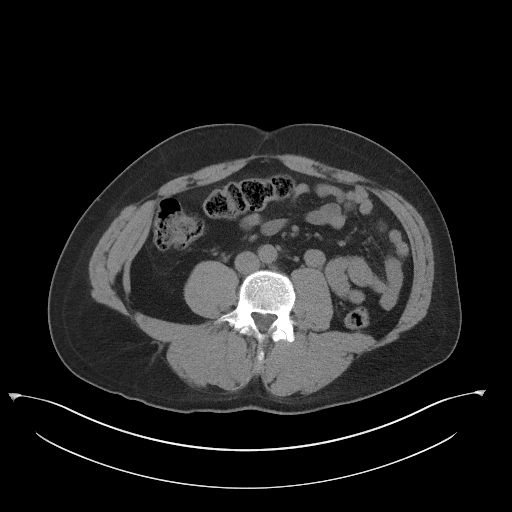
[im 55/98  soft-tissue]
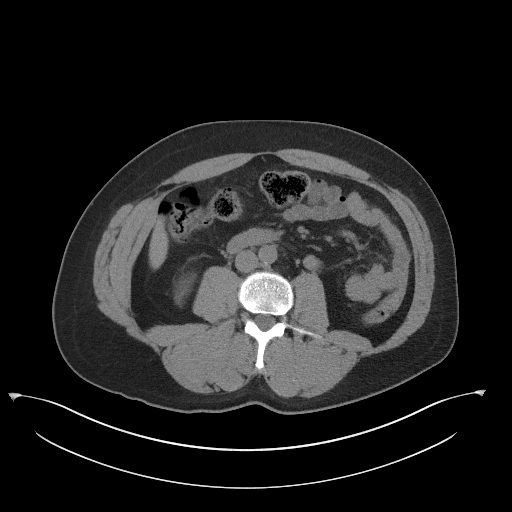
[im 63/98  soft-tissue]
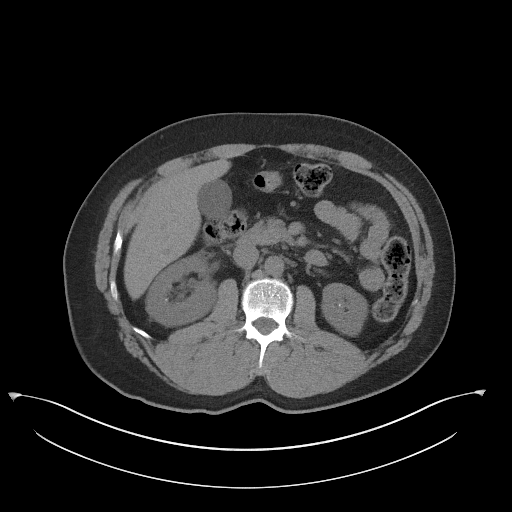
[im 63/98  bone]
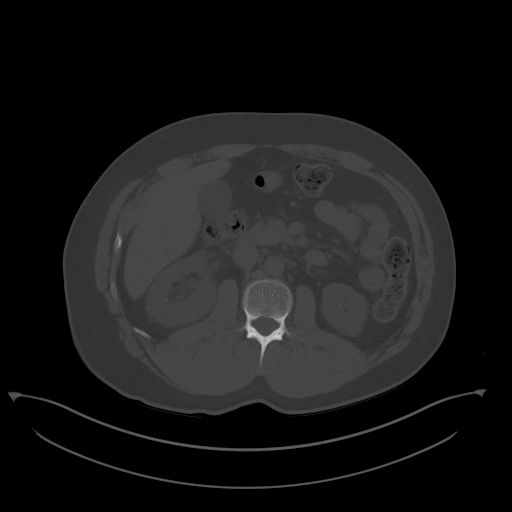
[im 70/98  soft-tissue]
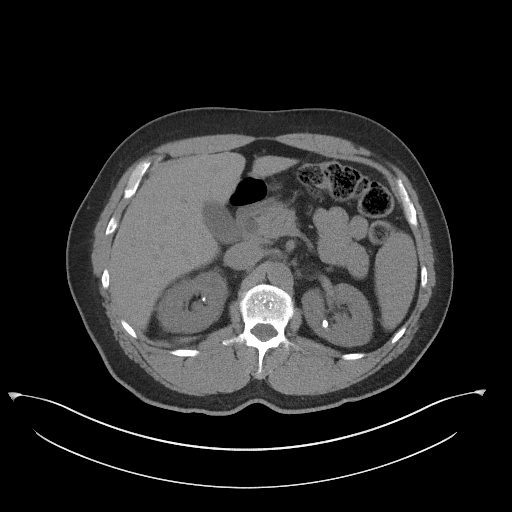
[im 78/98  soft-tissue]
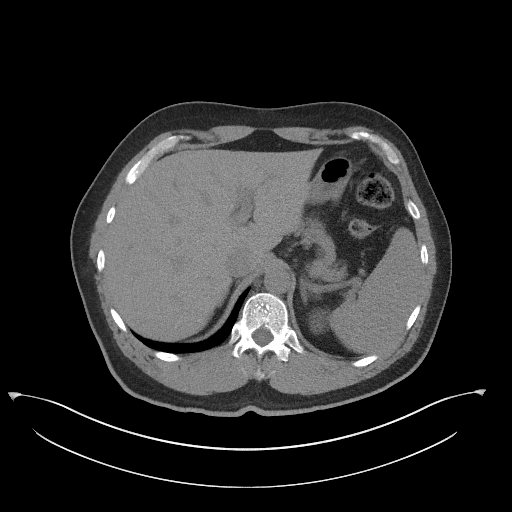
[im 86/98  soft-tissue]
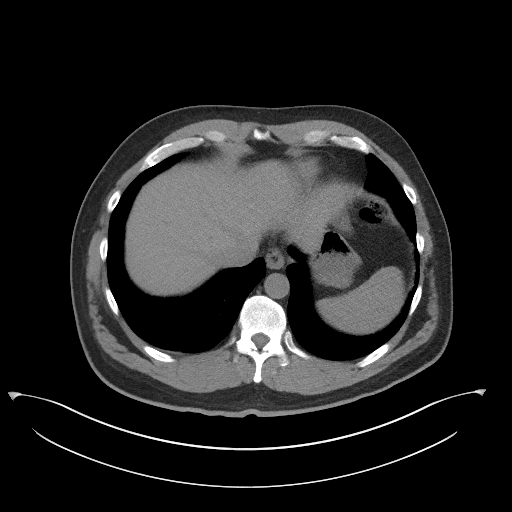
[im 94/98  soft-tissue]
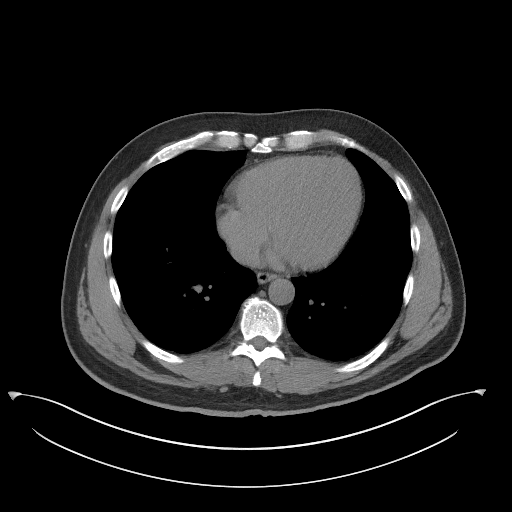

[Series 5: coronal · coronal · 0.80mm/px · 3 of 135 slices shown]
[im 45/135  soft-tissue]
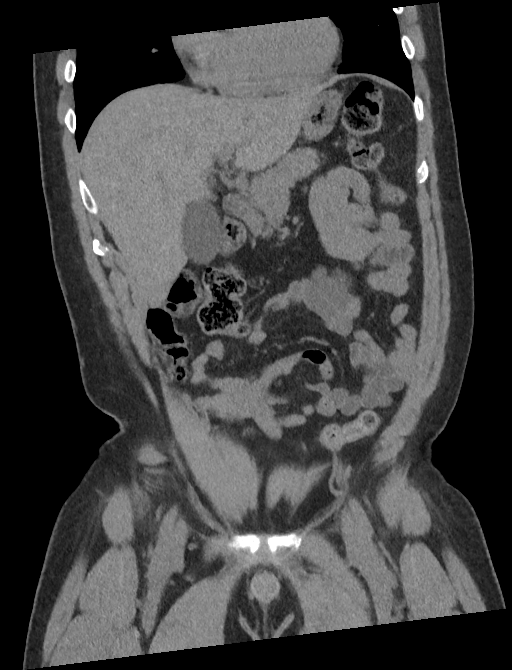
[im 60/135  soft-tissue]
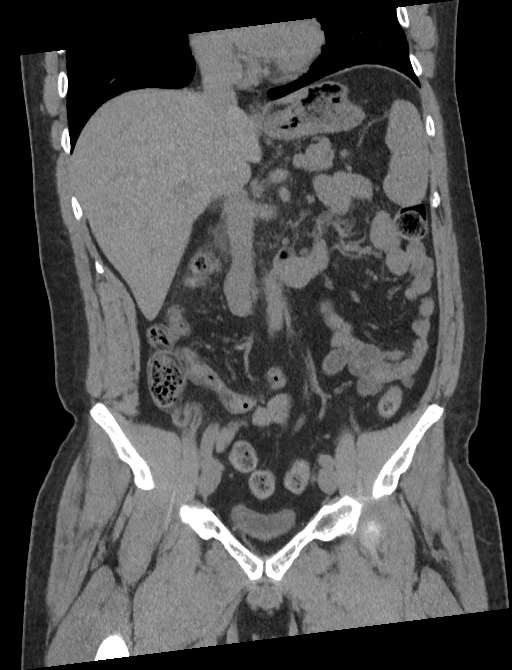
[im 75/135  soft-tissue]
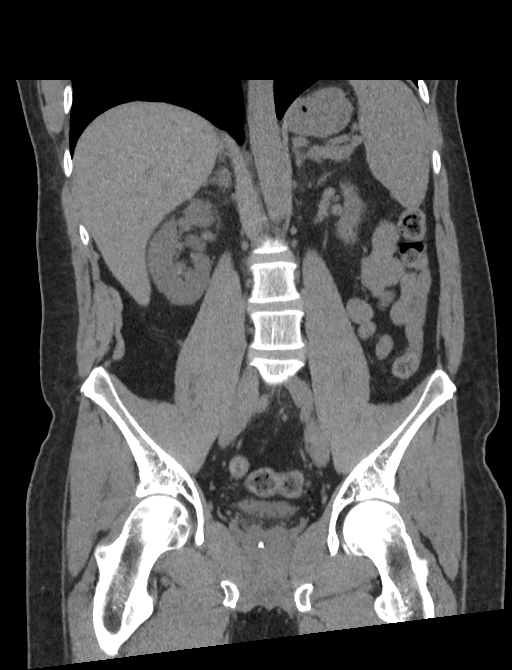

[16 of 46 positions shown; findings below may reference images not displayed]

FINDINGS: Lower chest: Clear lung bases.  Heart is normal in size.

Hepatobiliary: No focal liver abnormality is seen. No gallstones,
gallbladder wall thickening, or biliary dilatation.

Pancreas: Unremarkable. No pancreatic ductal dilatation or
surrounding inflammatory changes.

Spleen: Normal in size without focal abnormality.

Adrenals/Urinary Tract: No adrenal masses.

Kidneys normal in size, orientation and position. Mild right
hydronephrosis, due to a 6 mm stone that lies just below the
ureteropelvic junction. Right ureter below this is normal in course
and in caliber with no additional stones. No left hydronephrosis.
Normal left ureter. There are multiple bilateral nonobstructing
intrarenal stones. No renal masses. Mild right perinephric
stranding.

Bladder is decompressed, otherwise unremarkable.

Stomach/Bowel: Stomach is within normal limits. Appendix appears
normal. No evidence of bowel wall thickening, distention, or
inflammatory changes.

Vascular/Lymphatic: Minor aortic atherosclerotic calcifications. No
aneurysm. No enlarged lymph nodes.

Reproductive: Unremarkable.

Other: No abdominal wall hernia or abnormality. No abdominopelvic
ascites.

Musculoskeletal: No fracture or acute finding. No osteoblastic or
osteolytic lesions.
IMPRESSION: 1. 6 mm stone in the proximal right ureter just below the
ureteropelvic junction. This causes mild right hydronephrosis and
right perinephric stranding.
2. No other acute abnormality within the abdomen or pelvis.
3. Multiple bilateral nonobstructing intrarenal stones.
4. Minor aortic atherosclerosis.

## 2021-02-09 IMAGING — CR DG ABDOMEN 1V
1 series · 2 of 2 positions shown · non-contrast
Comparison: October 28, 2019.

CLINICAL DATA: Right ureteral stone.

EXAM:
ABDOMEN - 1 VIEW

[Series 1: dg abd 1 view · 0.14mm/px · 2 of 2 slices shown]
[im 1/2]
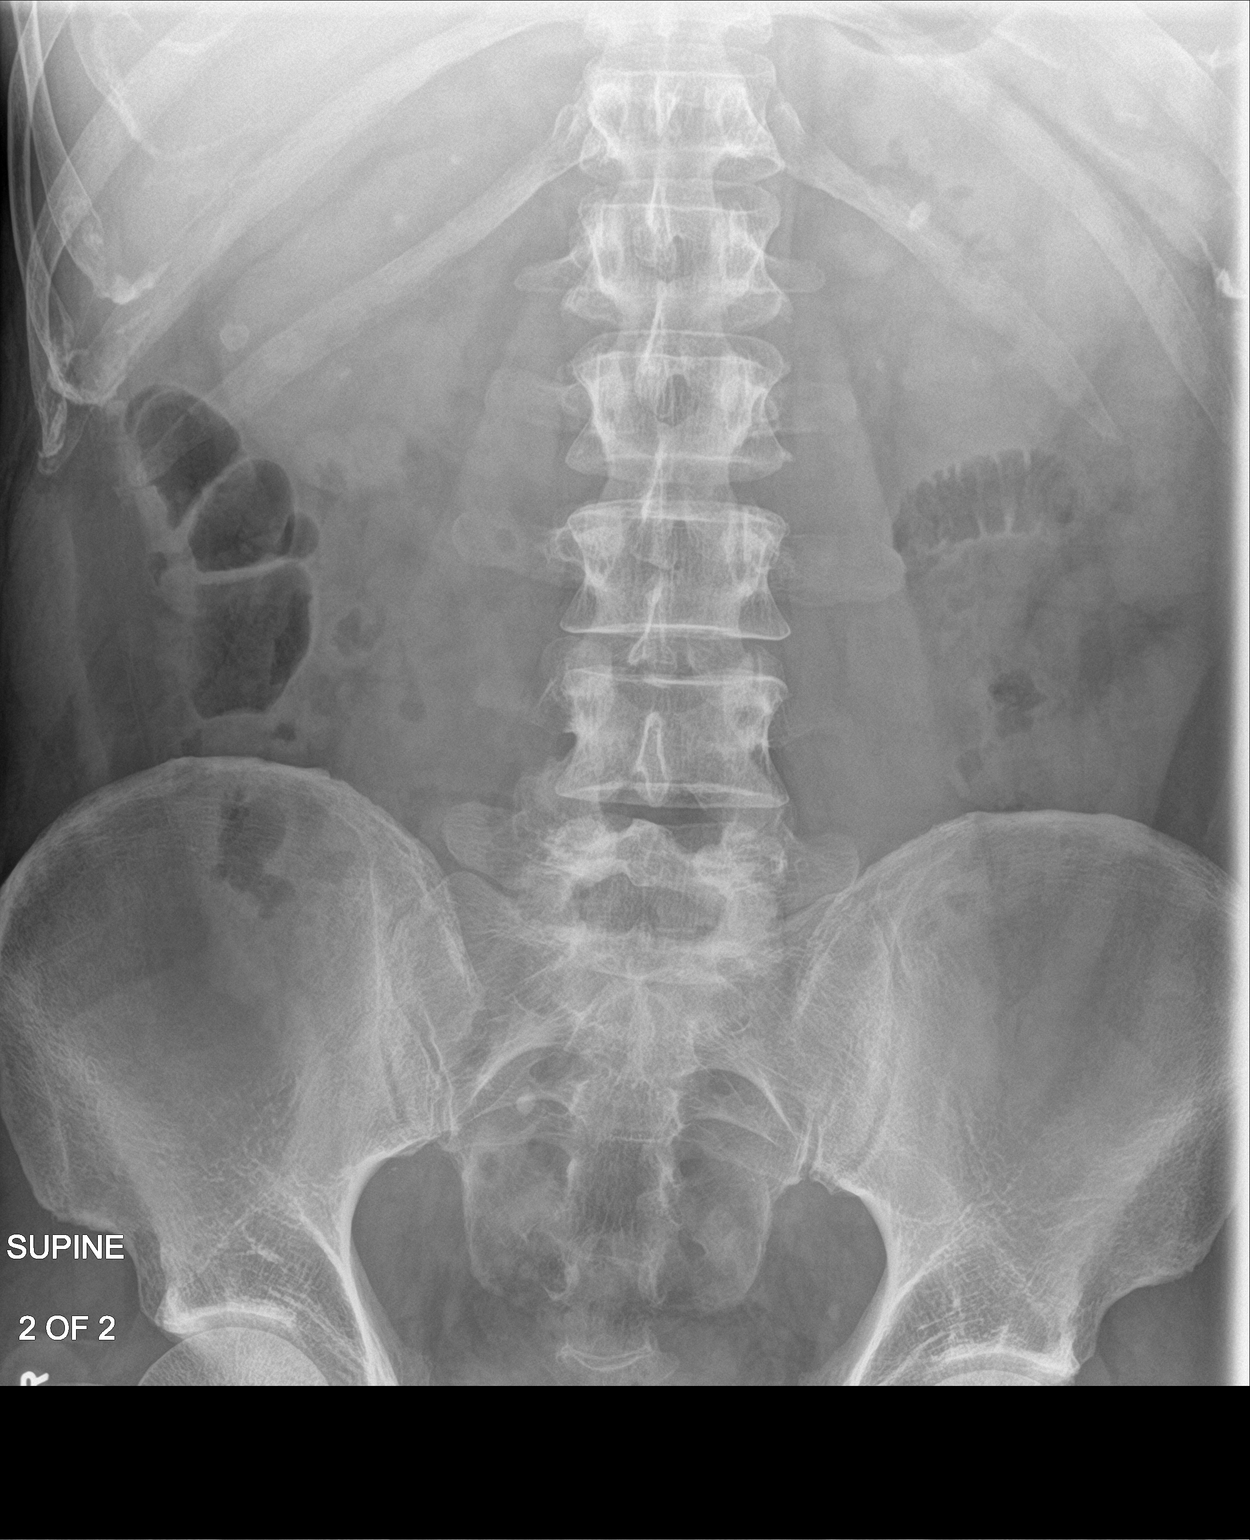
[im 2/2]
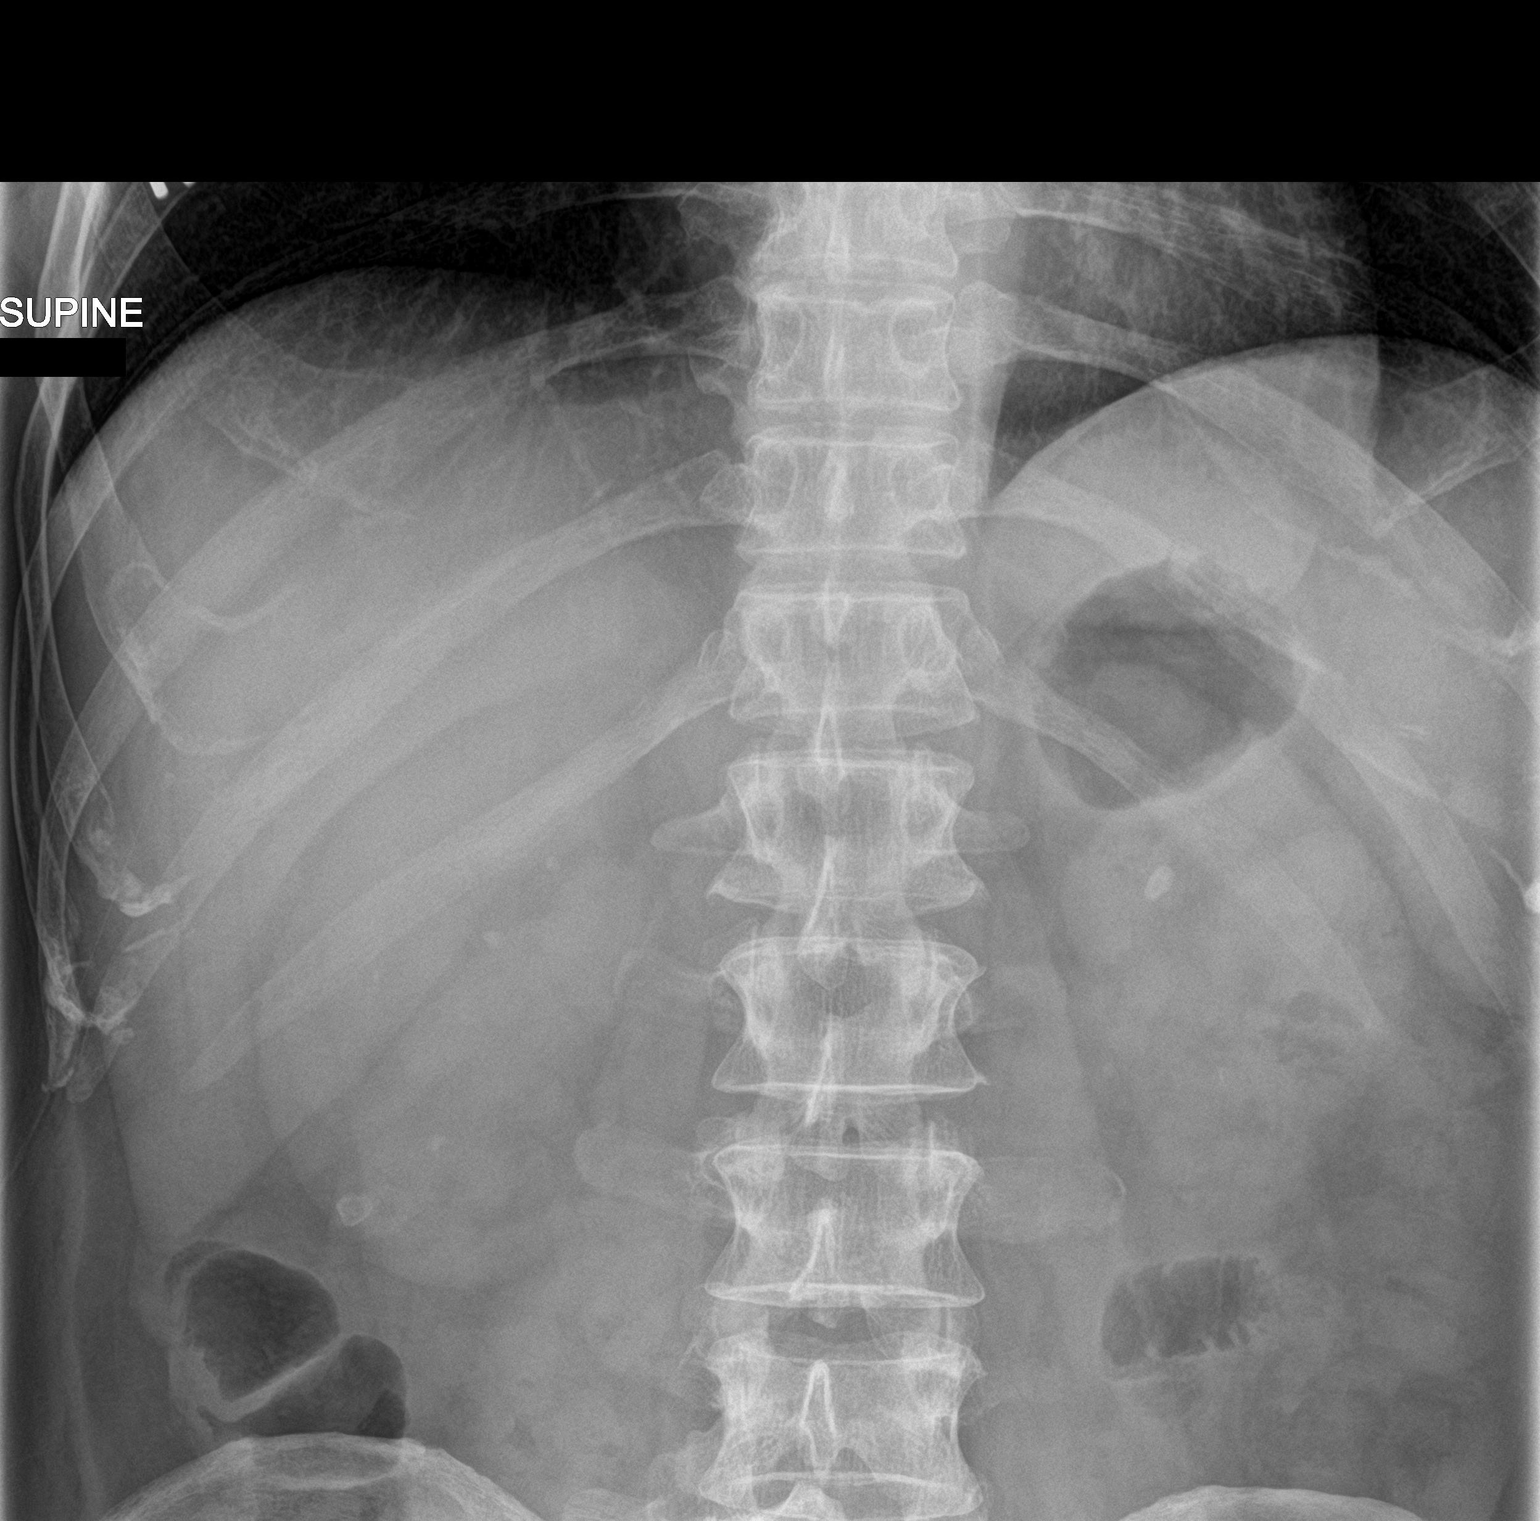

[2 of 2 positions shown; findings below may reference images not displayed]

FINDINGS: The bowel gas pattern is normal. Bilateral nephrolithiasis is again
noted. Proximal right ureteral calculus noted on prior exam appears
to have moved significantly more distally, projected over the right
sacrum.
IMPRESSION: Bilateral nephrolithiasis. Proximal right ureteral calculus noted on
prior exam appears to have moved significantly more distally,
projected over the right sacrum.

## 2021-09-08 DIAGNOSIS — H33002 Unspecified retinal detachment with retinal break, left eye: Secondary | ICD-10-CM | POA: Insufficient documentation

## 2021-11-10 DIAGNOSIS — J329 Chronic sinusitis, unspecified: Secondary | ICD-10-CM | POA: Insufficient documentation

## 2021-11-30 ENCOUNTER — Other Ambulatory Visit: Payer: Self-pay | Admitting: Otolaryngology

## 2021-11-30 DIAGNOSIS — J339 Nasal polyp, unspecified: Secondary | ICD-10-CM

## 2021-12-03 ENCOUNTER — Inpatient Hospital Stay: Admission: RE | Admit: 2021-12-03 | Payer: Managed Care, Other (non HMO) | Source: Ambulatory Visit

## 2021-12-03 ENCOUNTER — Ambulatory Visit
Admission: RE | Admit: 2021-12-03 | Discharge: 2021-12-03 | Disposition: A | Discharge status: Home / Self Care | Payer: Managed Care, Other (non HMO) | Source: Ambulatory Visit | Attending: Otolaryngology | Admitting: Otolaryngology

## 2021-12-03 DIAGNOSIS — J339 Nasal polyp, unspecified: Secondary | ICD-10-CM

## 2021-12-14 DIAGNOSIS — M19019 Primary osteoarthritis, unspecified shoulder: Secondary | ICD-10-CM | POA: Insufficient documentation

## 2021-12-15 DIAGNOSIS — M75110 Incomplete rotator cuff tear or rupture of unspecified shoulder, not specified as traumatic: Secondary | ICD-10-CM | POA: Insufficient documentation

## 2022-04-11 ENCOUNTER — Other Ambulatory Visit: Payer: Self-pay

## 2022-04-11 ENCOUNTER — Telehealth: Payer: Self-pay

## 2022-04-11 DIAGNOSIS — Z86010 Personal history of colonic polyps: Secondary | ICD-10-CM

## 2022-04-11 MED ORDER — NA SULFATE-K SULFATE-MG SULF 17.5-3.13-1.6 GM/177ML PO SOLN: 1.0000 | Freq: Once | ORAL | 0 refills | Status: AC

## 2022-04-11 NOTE — Telephone Encounter (Signed)
Gastroenterology Pre-Procedure Review  Request Date: 06/03/22 Requesting Physician: Dr. Allen Norris  PATIENT REVIEW QUESTIONS: The patient responded to the following health history questions as indicated:    1. Are you having any GI issues? no 2. Do you have a personal history of Polyps? yes (12/07/2018 last colonoscopy performed by Dr. Allen Norris at [facility]) 3. Do you have a family history of Colon Cancer or Polyps? no 4. Diabetes Mellitus? no 5. Joint replacements in the past 12 months?no 6. Major health problems in the past 3 months?no 7. Any artificial heart valves, MVP, or defibrillator?no    MEDICATIONS & ALLERGIES:    Patient reports the following regarding taking any anticoagulation/antiplatelet therapy:   Plavix, Coumadin, Eliquis, Xarelto, Lovenox, Pradaxa, Brilinta, or Effient? no Aspirin? no  Patient confirms/reports the following medications:  Current Outpatient Medications  Medication Sig Dispense Refill   indomethacin (INDOCIN) 50 MG capsule 50 mg 3 times daily; initiate within 24 to 48 hours of flare onset preferably; discontinue 2 to 3 days after resolution of clinical signs 30 capsule 1   ondansetron (ZOFRAN) 4 MG tablet Take 1 tablet (4 mg total) by mouth every 8 (eight) hours as needed for up to 10 doses for nausea or vomiting. 10 tablet 0   tamsulosin (FLOMAX) 0.4 MG CAPS capsule Take 1 capsule (0.4 mg total) by mouth daily after breakfast. 14 capsule 0   No current facility-administered medications for this visit.    Patient confirms/reports the following allergies:  No Known Allergies  No orders of the defined types were placed in this encounter.   AUTHORIZATION INFORMATION Primary Insurance: 1D#: Group #:  Secondary Insurance: 1D#: Group #:  SCHEDULE INFORMATION: Date:  Time: Location:

## 2022-04-18 DIAGNOSIS — M7502 Adhesive capsulitis of left shoulder: Secondary | ICD-10-CM | POA: Insufficient documentation

## 2022-05-24 ENCOUNTER — Encounter: Payer: Self-pay | Admitting: Gastroenterology

## 2022-05-24 ENCOUNTER — Other Ambulatory Visit: Payer: Self-pay

## 2022-06-03 ENCOUNTER — Ambulatory Visit: Payer: Managed Care, Other (non HMO) | Admitting: Anesthesiology

## 2022-06-03 ENCOUNTER — Ambulatory Visit
Admission: RE | Admit: 2022-06-03 | Discharge: 2022-06-03 | Disposition: A | Discharge status: Home / Self Care | Payer: Managed Care, Other (non HMO) | Attending: Gastroenterology | Admitting: Gastroenterology

## 2022-06-03 ENCOUNTER — Encounter: Payer: Self-pay | Admitting: Gastroenterology

## 2022-06-03 ENCOUNTER — Other Ambulatory Visit: Payer: Self-pay

## 2022-06-03 ENCOUNTER — Encounter
Admission: RE | Disposition: A | Discharge status: Home / Self Care | Payer: Self-pay | Source: Home / Self Care | Attending: Gastroenterology

## 2022-06-03 DIAGNOSIS — K635 Polyp of colon: Secondary | ICD-10-CM | POA: Diagnosis not present

## 2022-06-03 DIAGNOSIS — M109 Gout, unspecified: Secondary | ICD-10-CM | POA: Diagnosis not present

## 2022-06-03 DIAGNOSIS — Z86010 Personal history of colonic polyps: Secondary | ICD-10-CM | POA: Diagnosis not present

## 2022-06-03 DIAGNOSIS — D122 Benign neoplasm of ascending colon: Secondary | ICD-10-CM | POA: Diagnosis not present

## 2022-06-03 DIAGNOSIS — K640 First degree hemorrhoids: Secondary | ICD-10-CM | POA: Insufficient documentation

## 2022-06-03 DIAGNOSIS — Z09 Encounter for follow-up examination after completed treatment for conditions other than malignant neoplasm: Secondary | ICD-10-CM | POA: Diagnosis present

## 2022-06-03 HISTORY — PX: COLONOSCOPY WITH PROPOFOL: SHX5780

## 2022-06-03 SURGERY — COLONOSCOPY WITH PROPOFOL
Anesthesia: General | Site: Rectum

## 2022-06-03 MED ORDER — LIDOCAINE HCL (CARDIAC) PF 100 MG/5ML IV SOSY
PREFILLED_SYRINGE | INTRAVENOUS | Status: DC | PRN
  Administered 2022-06-03: 80 mg via INTRAVENOUS

## 2022-06-03 MED ORDER — PROPOFOL 10 MG/ML IV BOLUS
INTRAVENOUS | Status: DC | PRN
  Administered 2022-06-03 (×2): 40 mg via INTRAVENOUS
  Administered 2022-06-03: 150 mg via INTRAVENOUS

## 2022-06-03 MED ORDER — SODIUM CHLORIDE 0.9 % IV SOLN: INTRAVENOUS | Status: DC

## 2022-06-03 MED ORDER — LACTATED RINGERS IV SOLN: INTRAVENOUS | Status: DC

## 2022-06-03 MED ORDER — STERILE WATER FOR IRRIGATION IR SOLN
Status: DC | PRN
  Administered 2022-06-03: 150 mL

## 2022-06-03 SURGICAL SUPPLY — 21 items

## 2022-06-03 NOTE — H&P (Signed)
Midge Minium, MD [facility] [address], [address] [place], [place] [address] [phone] Fax : [phone]  Primary Care Physician:  Katharine Look, DO Primary Gastroenterologist:  Dr. Servando Snare  Pre-Procedure History & Physical: HPI:  Troy Solomon is a 52 y.o. male is here for an colonoscopy.   Past Medical History:  Diagnosis Date   Cough    Gout    Lumbago    PONV (postoperative nausea and vomiting)    URI (upper respiratory infection)    Wears contact lenses     Past Surgical History:  Procedure Laterality Date   CATARACT EXTRACTION W/PHACO Right 07/17/2018   Procedure: CATARACT EXTRACTION PHACO AND INTRAOCULAR LENS PLACEMENT ([place]) RIGHT  PANOPTIC LENS;  Surgeon: Galen Manila, MD;  Location: [facility];  Service: Ophthalmology;  Laterality: Right;   CATARACT EXTRACTION W/PHACO Left 08/14/2018   Procedure: CATARACT EXTRACTION PHACO AND INTRAOCULAR LENS PLACEMENT ([place])  LEFT PANOPTIX LENS;  Surgeon: Galen Manila, MD;  Location: [facility];  Service: Ophthalmology;  Laterality: Left;   COLONOSCOPY WITH PROPOFOL N/A 12/07/2018   Procedure: COLONOSCOPY WITH BIOPSY;  Surgeon: Midge Minium, MD;  Location: [facility];  Service: Endoscopy;  Laterality: N/A;  Placed clip x2 at Cecal Polyp removal site    EXTRACORPOREAL SHOCK WAVE LITHOTRIPSY Right 11/07/2019   Procedure: EXTRACORPOREAL SHOCK WAVE LITHOTRIPSY (ESWL);  Surgeon: Riki Altes, MD;  Location: [facility];  Service: Urology;  Laterality: Right;   FACIAL RECONSTRUCTION SURGERY     NASAL SINUS SURGERY     POLYPECTOMY N/A 12/07/2018   Procedure: POLYPECTOMY;  Surgeon: Midge Minium, MD;  Location: [facility];  Service: Endoscopy;  Laterality: N/A;   RETINAL DETACHMENT SURGERY     SHOULDER SURGERY     SHOULDER SURGERY      Prior to Admission medications   Medication Sig Start Date End Date Taking? Authorizing Provider  allopurinol (ZYLOPRIM) 100 MG tablet Take 100 mg  by mouth daily.   Yes [provider]  indomethacin (INDOCIN) 50 MG capsule 50 mg 3 times daily; initiate within 24 to 48 hours of flare onset preferably; discontinue 2 to 3 days after resolution of clinical signs Patient not taking: Reported on 05/24/2022 10/28/17   Tommie Sams, DO  ondansetron (ZOFRAN) 4 MG tablet Take 1 tablet (4 mg total) by mouth every 8 (eight) hours as needed for up to 10 doses for nausea or vomiting. Patient not taking: Reported on 05/24/2022 10/28/19   Gilles Chiquito, MD  tamsulosin [facility]) 0.4 MG CAPS capsule Take 1 capsule (0.4 mg total) by mouth daily after breakfast. Patient not taking: Reported on 05/24/2022 11/07/19   Riki Altes, MD    Allergies as of 04/11/2022   (No Known Allergies)    Family History  Problem Relation Age of Onset   Dementia Maternal Grandfather    Colon cancer Maternal Grandfather    Gout Father    Heart failure Paternal Grandfather    Heart disease Mother    Allergies Mother     Social History   Socioeconomic History   Marital status: Married    Spouse name: Not on file   Number of children: 3   Years of education: Not on file   Highest education level: Not on file  Occupational History   Occupation: Production designer, theatre/television/film of [employer]  Tobacco Use   Smoking status: Never   Smokeless tobacco: Never  Vaping Use   Vaping Use: Never used  Substance and Sexual Activity  Alcohol use: No   Drug use: No   Sexual activity: Not on file  Other Topics Concern   Not on file  Social History Narrative   Not on file   Social Determinants of Health   Financial Resource Strain: Not on file  Food Insecurity: Not on file  Transportation Needs: Not on file  Physical Activity: Not on file  Stress: Not on file  Social Connections: Not on file  Intimate Partner Violence: Not on file    Review of Systems: See HPI, otherwise negative ROS  Physical Exam: BP (!) 140/93   Temp 99.7 F (37.6 C) (Tympanic)   Resp 14   Ht 5\' 10"   (1.778 m)   Wt 96.4 kg   SpO2 98%   BMI 30.49 kg/m  General:   Alert,  pleasant and cooperative in NAD Head:  Normocephalic and atraumatic. Neck:  Supple; no masses or thyromegaly. Lungs:  Clear throughout to auscultation.    Heart:  Regular rate and rhythm. Abdomen:  Soft, nontender and nondistended. Normal bowel sounds, without guarding, and without rebound.   Neurologic:  Alert and  oriented x4;  grossly normal neurologically.  Impression/Plan: Troy Solomon is here for an colonoscopy to be performed for a history of adenomatous polyps on 2020   Risks, benefits, limitations, and alternatives regarding  colonoscopy have been reviewed with the patient.  Questions have been answered.  All parties agreeable.   Midge Minium, MD  06/03/2022, 8:46 AM

## 2022-06-03 NOTE — Op Note (Signed)
[facility] Gastroenterology Patient Name: Troy Solomon Procedure Date: 06/03/2022 9:16 AM MRN: [id number] Account #: [id number] Date of Birth: 13-Dec-1970 Admit Type: Outpatient Age: 52 Room: [facility] OR ROOM 01 Gender: Male Note Status: Finalized Instrument Name: [id number] Procedure:             Colonoscopy Indications:           High risk colon cancer surveillance: Personal history                         of colonic polyps Providers:             Midge Minium MD, MD Referring MD:          No Local Md, MD (Referring MD) Medicines:             Propofol per Anesthesia Complications:         No immediate complications. Procedure:             Pre-Anesthesia Assessment:                        - Prior to the procedure, a History and Physical was                         performed, and patient medications and allergies were                         reviewed. The patient's tolerance of previous                         anesthesia was also reviewed. The risks and benefits                         of the procedure and the sedation options and risks                         were discussed with the patient. All questions were                         answered, and informed consent was obtained. Prior                         Anticoagulants: The patient has taken no anticoagulant                         or antiplatelet agents. ASA Grade Assessment: II - A                         patient with mild systemic disease. After reviewing                         the risks and benefits, the patient was deemed in                         satisfactory condition to undergo the procedure.                        After obtaining informed consent, the colonoscope was  passed under direct vision. Throughout the procedure,                         the patient's blood pressure, pulse, and oxygen                         saturations were monitored continuously. The                          Colonoscope was introduced through the anus and                         advanced to the the cecum, identified by appendiceal                         orifice and ileocecal valve. The colonoscopy was                         performed without difficulty. The patient tolerated                         the procedure well. The quality of the bowel                         preparation was excellent. Findings:      The perianal and digital rectal examinations were normal.      A 6 mm polyp was found in the ascending colon. The polyp was sessile.       The polyp was removed with a cold snare. Resection and retrieval were       complete.      A 3 mm polyp was found in the ascending colon. The polyp was sessile.       The polyp was removed with a cold snare. Resection and retrieval were       complete.      Non-bleeding internal hemorrhoids were found during retroflexion. The       hemorrhoids were Grade I (internal hemorrhoids that do not prolapse). Impression:            - One 6 mm polyp in the ascending colon, removed with                         a cold snare. Resected and retrieved.                        - One 3 mm polyp in the ascending colon, removed with                         a cold snare. Resected and retrieved.                        - Non-bleeding internal hemorrhoids. Recommendation:        - Discharge patient to home.                        - Resume previous diet.                        - Continue present medications.                        -  Await pathology results.                        - Repeat colonoscopy in 7 years for surveillance. Procedure Code(s):     --- Professional ---                        [phone], Colonoscopy, flexible; with removal of                         tumor(s), polyp(s), or other lesion(s) by snare                         technique Diagnosis Code(s):     --- Professional ---                        Z86.010, Personal history of colonic polyps                         D12.2, Benign neoplasm of ascending colon CPT copyright 2022 American Medical Association. All rights reserved. The codes documented in this report are preliminary and upon coder review may  be revised to meet current compliance requirements. Midge Minium MD, MD 06/03/2022 9:41:46 AM This report has been signed electronically. Number of Addenda: 0 Note Initiated On: 06/03/2022 9:16 AM Scope Withdrawal Time: 0 hours 9 minutes 42 seconds  Total Procedure Duration: 0 hours 13 minutes 36 seconds  Estimated Blood Loss:  Estimated blood loss: none.      [facility]

## 2022-06-03 NOTE — Anesthesia Postprocedure Evaluation (Signed)
Anesthesia Post Note  Patient: Troy Solomon  Procedure(s) Performed: COLONOSCOPY WITH PROPOFOL (Rectum)  Patient location during evaluation: PACU Anesthesia Type: General Level of consciousness: awake and alert Pain management: pain level controlled Vital Signs Assessment: post-procedure vital signs reviewed and stable Respiratory status: spontaneous breathing, nonlabored ventilation, respiratory function stable and patient connected to nasal cannula oxygen Cardiovascular status: blood pressure returned to baseline and stable Postop Assessment: no apparent nausea or vomiting Anesthetic complications: no   No notable events documented.   Last Vitals:  Vitals:   06/03/22 0943 06/03/22 0951  BP: 111/88 113/85  Pulse: 88 88  Resp: 18 18  Temp: 36.5 C (!) 36.4 C  SpO2: 95% 96%    Last Pain:  Vitals:   06/03/22 0951  TempSrc:   PainSc: 0-No pain                 Marisue Humble

## 2022-06-03 NOTE — Anesthesia Preprocedure Evaluation (Signed)
Anesthesia Evaluation  Patient identified by MRN, date of birth, ID band Patient awake    Reviewed: Allergy & Precautions, H&P , NPO status , Patient's Chart, lab work & pertinent test results  History of Anesthesia Complications (+) PONV and history of anesthetic complications  Airway Mallampati: I  TM Distance: >3 FB Neck ROM: Full    Dental no notable dental hx.    Pulmonary neg pulmonary ROS   Pulmonary exam normal breath sounds clear to auscultation       Cardiovascular negative cardio ROS Normal cardiovascular exam Rhythm:Regular Rate:Normal     Neuro/Psych negative neurological ROS  negative psych ROS   GI/Hepatic negative GI ROS, Neg liver ROS,,,  Endo/Other  negative endocrine ROS    Renal/GU negative Renal ROS  negative genitourinary   Musculoskeletal negative musculoskeletal ROS (+)    Abdominal   Peds negative pediatric ROS (+)  Hematology negative hematology ROS (+)   Anesthesia Other Findings Gout Lumbago  Hx URI (upper respiratory infection) PONV (postoperative nausea and vomiting Wears contacts    Reproductive/Obstetrics negative OB ROS                             Anesthesia Physical Anesthesia Plan  ASA: 2  Anesthesia Plan: General   Post-op Pain Management:    Induction: Intravenous  PONV Risk Score and Plan:   Airway Management Planned: Natural Airway and Nasal Cannula  Additional Equipment:   Intra-op Plan:   Post-operative Plan:   Informed Consent: I have reviewed the patients History and Physical, chart, labs and discussed the procedure including the risks, benefits and alternatives for the proposed anesthesia with the patient or authorized representative who has indicated his/her understanding and acceptance.     Dental Advisory Given  Plan Discussed with: Anesthesiologist, CRNA and Surgeon  Anesthesia Plan Comments: (Patient consented  for risks of anesthesia including but not limited to:  - adverse reactions to medications - risk of airway placement if required - damage to eyes, teeth, lips or other oral mucosa - nerve damage due to positioning  - sore throat or hoarseness - Damage to heart, brain, nerves, lungs, other parts of body or loss of life  Patient voiced understanding.)       Anesthesia Quick Evaluation

## 2022-06-03 NOTE — Transfer of Care (Signed)
Immediate Anesthesia Transfer of Care Note  Patient: Troy Solomon  Procedure(s) Performed: COLONOSCOPY WITH PROPOFOL (Rectum)  Patient Location: PACU  Anesthesia Type: General  Level of Consciousness: awake, alert  and patient cooperative  Airway and Oxygen Therapy: Patient Spontanous Breathing and Patient connected to supplemental oxygen  Post-op Assessment: Post-op Vital signs reviewed, Patient's Cardiovascular Status Stable, Respiratory Function Stable, Patent Airway and No signs of Nausea or vomiting  Post-op Vital Signs: Reviewed and stable  Complications: No notable events documented.

## 2022-06-06 ENCOUNTER — Encounter: Payer: Self-pay | Admitting: Gastroenterology

## 2022-06-06 DIAGNOSIS — Z820 Family history of epilepsy and other diseases of the nervous system: Secondary | ICD-10-CM | POA: Insufficient documentation

## 2022-06-07 ENCOUNTER — Encounter: Payer: Self-pay | Admitting: Gastroenterology

## 2022-06-07 LAB — SURGICAL PATHOLOGY

## 2022-10-17 ENCOUNTER — Other Ambulatory Visit
Admission: RE | Admit: 2022-10-17 | Discharge: 2022-10-17 | Disposition: A | Discharge status: Home / Self Care | Payer: Managed Care, Other (non HMO) | Source: Home / Self Care | Attending: Urology | Admitting: Urology

## 2022-10-17 ENCOUNTER — Other Ambulatory Visit: Payer: Self-pay | Admitting: Urology

## 2022-10-17 ENCOUNTER — Ambulatory Visit
Admission: RE | Admit: 2022-10-17 | Discharge: 2022-10-17 | Disposition: A | Discharge status: Home / Self Care | Payer: Managed Care, Other (non HMO) | Source: Ambulatory Visit | Attending: Urology | Admitting: Urology

## 2022-10-17 ENCOUNTER — Encounter: Payer: Self-pay | Admitting: Urology

## 2022-10-17 ENCOUNTER — Ambulatory Visit
Admission: RE | Admit: 2022-10-17 | Discharge: 2022-10-17 | Disposition: A | Discharge status: Home / Self Care | Payer: Managed Care, Other (non HMO) | Attending: Urology | Admitting: Urology

## 2022-10-17 ENCOUNTER — Ambulatory Visit: Payer: Managed Care, Other (non HMO) | Admitting: Urology

## 2022-10-17 VITALS — BP 148/92 | HR 94 | Ht 70.0 in

## 2022-10-17 DIAGNOSIS — Z87442 Personal history of urinary calculi: Secondary | ICD-10-CM

## 2022-10-17 DIAGNOSIS — N200 Calculus of kidney: Secondary | ICD-10-CM

## 2022-10-17 DIAGNOSIS — R109 Unspecified abdominal pain: Secondary | ICD-10-CM | POA: Diagnosis not present

## 2022-10-17 LAB — URINALYSIS, COMPLETE (UACMP) WITH MICROSCOPIC
Glucose, UA: NEGATIVE mg/dL
Ketones, ur: NEGATIVE mg/dL
Leukocytes,Ua: NEGATIVE
Nitrite: NEGATIVE
Protein, ur: NEGATIVE mg/dL
Specific Gravity, Urine: 1.025 (ref 1.005–1.030)
WBC, UA: NONE SEEN WBC/hpf (ref 0–5)
pH: 5.5 (ref 5.0–8.0)

## 2022-10-17 MED ORDER — OXYCODONE-ACETAMINOPHEN 5-325 MG PO TABS: 1.0000 | ORAL_TABLET | ORAL | 0 refills | Status: DC | PRN

## 2022-10-17 MED ORDER — TAMSULOSIN HCL 0.4 MG PO CAPS
0.4000 mg | ORAL_CAPSULE | Freq: Every day | ORAL | 3 refills | Status: DC
Start: 2022-10-17 — End: 2022-11-14

## 2022-10-17 NOTE — Progress Notes (Unsigned)
10/17/2022 11:37 AM   Troy Solomon 12-18-1970 [id number]  Referring provider: Katharine Look, DO [address] [place],  [place] [address]  Urological history: 1.  Nephrolithiasis -stone composition unknown -Spontaneous stone approximately 30 years ago -right ESWL (2021)   Chief Complaint  Patient presents with   Nephrolithiasis   HPI: Troy Solomon is a 52 y.o. male who presents today for severe pain in side.  Previous records reviewed.   Last week, he had an episode of left side pain that lasted for several minutes and then abated.  Then yesterday, the left flank pain returned and was more intense.  He experienced an 8 out of 10 pain.  Patient denies any modifying or aggravating factors.  Patient denies any recent UTI's, gross hematuria, dysuria or suprapubic pain.  Patient denies any fevers, chills, nausea or vomiting.   UA yellow clear, specific gravity 1.025, pH 5.5, moderate heme, small bilirubin, 0-5 squames, 11-20 RBCs, rare bacteria and calcium oxalate crystals present.  KUB 3 mm calcification in the vicinity of the distal ureter, 8 mm left renal stone and right kidney obscured by stool and gas  PMH: Past Medical History:  Diagnosis Date   Cough    Gout    Kidney stone    Lumbago    PONV (postoperative nausea and vomiting)    URI (upper respiratory infection)    Wears contact lenses     Surgical History: Past Surgical History:  Procedure Laterality Date   CATARACT EXTRACTION W/PHACO Right 07/17/2018   Procedure: CATARACT EXTRACTION PHACO AND INTRAOCULAR LENS PLACEMENT (IOC) RIGHT  PANOPTIC LENS;  Surgeon: Galen Manila, MD;  Location: [facility];  Service: Ophthalmology;  Laterality: Right;   CATARACT EXTRACTION W/PHACO Left 08/14/2018   Procedure: CATARACT EXTRACTION PHACO AND INTRAOCULAR LENS PLACEMENT (IOC)  LEFT PANOPTIX LENS;  Surgeon: Galen Manila, MD;  Location: [facility];  Service: Ophthalmology;   Laterality: Left;   COLONOSCOPY WITH PROPOFOL N/A 12/07/2018   Procedure: COLONOSCOPY WITH BIOPSY;  Surgeon: Midge Minium, MD;  Location: [facility];  Service: Endoscopy;  Laterality: N/A;  Placed clip x2 at Cecal Polyp removal site    COLONOSCOPY WITH PROPOFOL N/A 06/03/2022   Procedure: COLONOSCOPY WITH PROPOFOL;  Surgeon: Midge Minium, MD;  Location: [facility];  Service: Endoscopy;  Laterality: N/A;   EXTRACORPOREAL SHOCK WAVE LITHOTRIPSY Right 11/07/2019   Procedure: EXTRACORPOREAL SHOCK WAVE LITHOTRIPSY (ESWL);  Surgeon: Riki Altes, MD;  Location: [facility];  Service: Urology;  Laterality: Right;   FACIAL RECONSTRUCTION SURGERY     NASAL SINUS SURGERY     POLYPECTOMY N/A 12/07/2018   Procedure: POLYPECTOMY;  Surgeon: Midge Minium, MD;  Location: [facility];  Service: Endoscopy;  Laterality: N/A;   RETINAL DETACHMENT SURGERY     SHOULDER SURGERY     SHOULDER SURGERY      Home Medications:  Allergies as of 10/17/2022   No Known Allergies      Medication List        Accurate as of October 17, 2022 11:37 AM. If you have any questions, ask your nurse or doctor.          STOP taking these medications    ondansetron 4 MG tablet Commonly known as: Zofran Stopped by: Abdulah Iqbal       TAKE these medications    allopurinol 100 MG tablet Commonly known as: ZYLOPRIM Take 100 mg by mouth daily.   colchicine 0.6 MG tablet TAKE 2  TABLETS BY MOUTH ON FIRST SIGN OF FLARE, FOLLOWED BY 1 TABLET AFTER 1 HOURS. ON DAY 2 AND AFTER, TAKE 1 TABLET TWICE DAILY UNTIL RESOLVES   indomethacin 50 MG capsule Commonly known as: INDOCIN 50 mg 3 times daily; initiate within 24 to 48 hours of flare onset preferably; discontinue 2 to 3 days after resolution of clinical signs   oxyCODONE-acetaminophen 5-325 MG tablet Commonly known as: Percocet Take 1 tablet by mouth every 4 (four) hours as needed for severe pain. Started by: Michiel Cowboy   tamsulosin  0.4 MG Caps capsule Commonly known as: FLOMAX Take 1 capsule (0.4 mg total) by mouth daily after breakfast. What changed: Another medication with the same name was added. Make sure you understand how and when to take each. Changed by: Michiel Cowboy   tamsulosin 0.4 MG Caps capsule Commonly known as: FLOMAX Take 1 capsule (0.4 mg total) by mouth daily. What changed: You were already taking a medication with the same name, and this prescription was added. Make sure you understand how and when to take each. Changed by: Michiel Cowboy        Allergies: No Known Allergies  Family History: Family History  Problem Relation Age of Onset   Dementia Maternal Grandfather    Colon cancer Maternal Grandfather    Gout Father    Heart failure Paternal Grandfather    Heart disease Mother    Allergies Mother     Social History:  reports that he has never smoked. He has never used smokeless tobacco. He reports that he does not drink alcohol and does not use drugs.  ROS: Pertinent ROS in HPI  Physical Exam: BP (!) 148/92 (BP Location: Left Arm, Patient Position: Sitting, Cuff Size: Large)   Pulse 94   Ht 5\' 10"  (1.778 m)   BMI 30.49 kg/m   Constitutional:  Well nourished. Alert and oriented, No acute distress. HEENT: [place] AT, moist mucus membranes.  Trachea midline Cardiovascular: No clubbing, cyanosis, or edema. Respiratory: Normal respiratory effort, no increased work of breathing. Neurologic: Grossly intact, no focal deficits, moving all 4 extremities. Psychiatric: Normal mood and affect.  Laboratory Data: Urinalysis Component     Latest Ref Rng 10/17/2022  Color, Urine     YELLOW  YELLOW   Appearance     CLEAR  CLEAR   Specific Gravity, Urine     1.005 - 1.030  1.025   pH     5.0 - 8.0  5.5   Glucose, UA     NEGATIVE mg/dL NEGATIVE   Hgb urine dipstick     NEGATIVE  MODERATE !   Bilirubin Urine     NEGATIVE  SMALL !   Ketones, ur     NEGATIVE mg/dL NEGATIVE    Protein     NEGATIVE mg/dL NEGATIVE   Nitrite     NEGATIVE  NEGATIVE   Leukocytes,Ua     NEGATIVE  NEGATIVE   RBC / HPF     0 - 5 RBC/hpf 11-20   WBC, UA     0 - 5 WBC/hpf NONE SEEN   Bacteria, UA     NONE SEEN  RARE !   Squamous Epithelial / HPF     0 - 5 /HPF 0-5   Ca Oxalate Crys, UA PRESENT     Legend: ! Abnormal I have reviewed the labs.   Pertinent Imaging: Radiology report still pending   Assessment & Plan:    1. Flank pain -UA w/ micro  heme -urine culture pending -KUB with left UVJ stone  2. Nephrolithiasis -hx of bilateral nephrolithiasis -Right kidney obscured by stool with gas, so I could not evaluate for stones of the right -On the left a stable 8 mm stone seen and likely a 3 mm left distal ureteral stone -We discussed various treatment options for urolithiasis including observation with or without medical expulsive therapy, shockwave lithotripsy (SWL), ureteroscopy and laser lithotripsy with stent placement. -We discussed that management is based on stone size, location, density, patient co-morbidities, and patient preference.  -Stones <70mm in size have a >80% spontaneous passage rate. Data surrounding the use of tamsulosin for medical expulsive therapy is controversial, but meta analyses suggests it is most efficacious for distal stones between 5-23mm in size. Possible side effects include dizziness/lightheadedness -SWL has a lower stone free rate in a single procedure, but also a lower complication rate compared to ureteroscopy and avoids a stent and associated stent related symptoms. Possible complications include renal hematoma, steinstrasse, and need for additional treatment.  -Ureteroscopy with laser lithotripsy and stent placement has a higher stone free rate than SWL in a single procedure, however increased complication rate including possible infection, ureteral injury, bleeding, and stent related morbidity. Common stent related symptoms include dysuria,  urgency/frequency, and flank pain. -he would like a trial of MET  -he is given a prescription of Percocet 5/325, # 10 tablets daily along with tamsulosin 0.4 mg daily -we gave him a strainer and instructed him to strain his urine   Return in about 2 weeks (around 10/31/2022).  These notes generated with voice recognition software. I apologize for typographical errors.  Cloretta Ned  [facility] [address]  [address] [place], [place] [address] [phone]

## 2022-10-18 LAB — URINE CULTURE: Culture: NO GROWTH

## 2022-11-01 ENCOUNTER — Other Ambulatory Visit: Payer: Self-pay | Admitting: *Deleted

## 2022-11-01 DIAGNOSIS — N200 Calculus of kidney: Secondary | ICD-10-CM

## 2022-11-01 NOTE — Addendum Note (Signed)
Addended by: Ples Specter L on: 11/01/2022 10:02 AM   Modules accepted: Orders

## 2022-11-01 NOTE — Addendum Note (Signed)
Addended by: Levada Schilling on: 11/01/2022 10:11 AM   Modules accepted: Orders

## 2022-11-02 ENCOUNTER — Ambulatory Visit: Payer: Managed Care, Other (non HMO) | Admitting: Physician Assistant

## 2022-11-09 ENCOUNTER — Ambulatory Visit (HOSPITAL_COMMUNITY): Payer: Managed Care, Other (non HMO)

## 2022-11-09 ENCOUNTER — Ambulatory Visit
Admission: RE | Admit: 2022-11-09 | Discharge: 2022-11-09 | Disposition: A | Discharge status: Home / Self Care | Payer: Managed Care, Other (non HMO) | Source: Ambulatory Visit | Attending: Physician Assistant | Admitting: Physician Assistant

## 2022-11-09 DIAGNOSIS — N200 Calculus of kidney: Secondary | ICD-10-CM

## 2022-11-14 ENCOUNTER — Encounter: Payer: Self-pay | Admitting: Physician Assistant

## 2022-11-14 ENCOUNTER — Ambulatory Visit: Payer: Managed Care, Other (non HMO) | Admitting: Physician Assistant

## 2022-11-14 VITALS — BP 147/85 | HR 70

## 2022-11-14 DIAGNOSIS — N201 Calculus of ureter: Secondary | ICD-10-CM

## 2022-11-14 LAB — URINALYSIS, COMPLETE
Bilirubin, UA: NEGATIVE
Glucose, UA: NEGATIVE
Ketones, UA: NEGATIVE
Leukocytes,UA: NEGATIVE
Nitrite, UA: NEGATIVE
Protein,UA: NEGATIVE
RBC, UA: NEGATIVE
Specific Gravity, UA: 1.025 (ref 1.005–1.030)
Urobilinogen, Ur: 1 mg/dL (ref 0.2–1.0)
pH, UA: 6 (ref 5.0–7.5)

## 2022-11-14 LAB — MICROSCOPIC EXAMINATION: Epithelial Cells (non renal): >10 /hpf — AB (ref 0–10)

## 2022-11-14 NOTE — Progress Notes (Signed)
11/14/2022 2:18 PM   Troy Solomon [birth year] [id number]  CC: Chief Complaint  Patient presents with   Follow-up   HPI: Troy Solomon is a 52 y.o. male with PMH nephrolithiasis who presents today for follow-up of a distal left ureteral stone.  He saw Michiel Cowboy in clinic on 10/17/2018 for with reports of left flank pain.  UA was notable for micro heme.  KUB showed a 3 mm distal left ureteral stone.   Today he reports he never saw the stone pass, however his pain significantly improved about a week after his office visit.  He reports some mild persistent left flank discomfort when he takes a long car rides.  KUB today with interval resolution of the distal left ureteral stone.  In-office UA today pan negative; urine microscopy with >10 epithelial cells/hpf.   PMH: Past Medical History:  Diagnosis Date   Cough    Gout    Kidney stone    Lumbago    PONV (postoperative nausea and vomiting)    URI (upper respiratory infection)    Wears contact lenses    Surgical History: Past Surgical History:  Procedure Laterality Date   CATARACT EXTRACTION W/PHACO Right 07/17/2018   Procedure: CATARACT EXTRACTION PHACO AND INTRAOCULAR LENS PLACEMENT (IOC) RIGHT  PANOPTIC LENS;  Surgeon: Galen Manila, MD;  Location: [facility];  Service: Ophthalmology;  Laterality: Right;   CATARACT EXTRACTION W/PHACO Left 08/14/2018   Procedure: CATARACT EXTRACTION PHACO AND INTRAOCULAR LENS PLACEMENT (IOC)  LEFT PANOPTIX LENS;  Surgeon: Galen Manila, MD;  Location: [facility];  Service: Ophthalmology;  Laterality: Left;   COLONOSCOPY WITH PROPOFOL N/A 12/07/2018   Procedure: COLONOSCOPY WITH BIOPSY;  Surgeon: Midge Minium, MD;  Location: [facility];  Service: Endoscopy;  Laterality: N/A;  Placed clip x2 at Cecal Polyp removal site    COLONOSCOPY WITH PROPOFOL N/A 06/03/2022   Procedure: COLONOSCOPY WITH PROPOFOL;  Surgeon: Midge Minium, MD;  Location: [facility];  Service: Endoscopy;  Laterality: N/A;   EXTRACORPOREAL SHOCK WAVE LITHOTRIPSY Right 11/07/2019   Procedure: EXTRACORPOREAL SHOCK WAVE LITHOTRIPSY (ESWL);  Surgeon: Riki Altes, MD;  Location: [facility];  Service: Urology;  Laterality: Right;   FACIAL RECONSTRUCTION SURGERY     NASAL SINUS SURGERY     POLYPECTOMY N/A 12/07/2018   Procedure: POLYPECTOMY;  Surgeon: Midge Minium, MD;  Location: [facility];  Service: Endoscopy;  Laterality: N/A;   RETINAL DETACHMENT SURGERY     SHOULDER SURGERY     SHOULDER SURGERY      Home Medications:  Allergies as of 11/14/2022   No Known Allergies      Medication List        Accurate as of November 14, 2022  2:18 PM. If you have any questions, ask your nurse or doctor.          allopurinol 100 MG tablet Commonly known as: ZYLOPRIM Take 100 mg by mouth daily.   colchicine 0.6 MG tablet TAKE 2 TABLETS BY MOUTH ON FIRST SIGN OF FLARE, FOLLOWED BY 1 TABLET AFTER 1 HOURS. ON DAY 2 AND AFTER, TAKE 1 TABLET TWICE DAILY UNTIL RESOLVES   indomethacin 50 MG capsule Commonly known as: INDOCIN 50 mg 3 times daily; initiate within 24 to 48 hours of flare onset preferably; discontinue 2 to 3 days after resolution of clinical signs   oxyCODONE-acetaminophen 5-325 MG tablet Commonly known as: Percocet Take 1 tablet by mouth every 4 (four) hours as needed for severe  pain.   tamsulosin 0.4 MG Caps capsule Commonly known as: FLOMAX Take 1 capsule (0.4 mg total) by mouth daily after breakfast.   tamsulosin 0.4 MG Caps capsule Commonly known as: FLOMAX Take 1 capsule (0.4 mg total) by mouth daily.        Allergies:  No Known Allergies  Family History: Family History  Problem Relation Age of Onset   Dementia Maternal Grandfather    Colon cancer Maternal Grandfather    Gout Father    Heart failure Paternal Grandfather    Heart disease Mother    Allergies Mother     Social History:   reports that he has never  smoked. He has never used smokeless tobacco. He reports that he does not drink alcohol and does not use drugs.  Physical Exam: There were no vitals taken for this visit.  Constitutional:  Alert and oriented, no acute distress, nontoxic appearing HEENT: [place], AT Cardiovascular: No clubbing, cyanosis, or edema Respiratory: Normal respiratory effort, no increased work of breathing Skin: No rashes, bruises or suspicious lesions Neurologic: Grossly intact, no focal deficits, moving all 4 extremities Psychiatric: Normal mood and affect  Laboratory Data: Results for orders placed or performed in visit on 11/14/22  Microscopic Examination   Urine  Result Value Ref Range   WBC, UA 0-5 0 - 5 /hpf   RBC, Urine 0-2 0 - 2 /hpf   Epithelial Cells (non renal) >10 (A) 0 - 10 /hpf   Bacteria, UA Few None seen/Few  Urinalysis, Complete  Result Value Ref Range   Specific Gravity, UA 1.025 1.005 - 1.030   pH, UA 6.0 5.0 - 7.5   Color, UA Yellow Yellow   Appearance Ur Clear Clear   Leukocytes,UA Negative Negative   Protein,UA Negative Negative/Trace   Glucose, UA Negative Negative   Ketones, UA Negative Negative   RBC, UA Negative Negative   Bilirubin, UA Negative Negative   Urobilinogen, Ur 1.0 0.2 - 1.0 mg/dL   Nitrite, UA Negative Negative   Microscopic Examination See below:    Pertinent Imaging: KUB, 11/09/2022: CLINICAL DATA:  Kidney stones.   EXAM: ABDOMEN - 1 VIEW   COMPARISON:  10/17/2022   FINDINGS: The bowel gas pattern is normal. Calcifications overlying both kidneys measuring up to 7 mm consistent with stones which can be evaluated further with noncontrast CT.   IMPRESSION: Bilateral nephrolithiasis.     Electronically Signed   By: Layla Maw M.D.   On: 11/26/2022 12:14  I personally reviewed the images referenced above and note interval resolution of the distal left ureteral stone.  Assessment & Plan:   1. Left ureteral stone Symptoms have largely resolved,  microscopic hematuria has resolved, and interval resolution of the stone on KUB today.  I offered him renal ultrasound versus CT stone study for further evaluation of a possible retained stone given her reports of left flank discomfort with long car rides, however he declined this, which is reasonable.  We discussed returning to clinic with worsening symptoms, at which point would pursue additional imaging. - Urinalysis, Complete   Return if symptoms worsen or fail to improve.  Carman Ching, PA-C  [facility] [place] [address] [place], [place] [address] [phone]

## 2023-02-09 ENCOUNTER — Ambulatory Visit
Admission: RE | Admit: 2023-02-09 | Discharge: 2023-02-09 | Disposition: A | Discharge status: Home / Self Care | Payer: Managed Care, Other (non HMO) | Source: Ambulatory Visit | Attending: Urology | Admitting: Urology

## 2023-02-09 ENCOUNTER — Other Ambulatory Visit: Payer: Self-pay | Admitting: Urology

## 2023-02-09 DIAGNOSIS — R109 Unspecified abdominal pain: Secondary | ICD-10-CM

## 2023-02-09 NOTE — Progress Notes (Signed)
02/10/2023 9:59 AM   [name] 30-Dec-1970 [id number]  Referring provider: Katharine Look, DO [address] [place],  [place] [address]  Urological history: 1.  Nephrolithiasis -right ESWL  -KUB (11/2022) bilateral nephrolithiasis  Chief Complaint  Patient presents with   Acute Visit   Flank Pain    HPI: AYOUB DOCKETT is a 52 y.o. male who presents today for back and side pain.    Previous records reviewed.   He states over the last week, he has been having left-sided pain and intermittent pain that runs through his penis.  Patient denies any modifying or aggravating factors.  Patient denies any recent UTI's, gross hematuria, dysuria or suprapubic/flank pain.  Patient denies any fevers, chills, nausea or vomiting.    He had blood work recently and his serum creatinine was slightly elevated at 1.31 from 1.17 6 months ago.    KUB bilateral nephrolithiasis  UA benign   PMH: Past Medical History:  Diagnosis Date   Cough    Gout    Kidney stone    Lumbago    PONV (postoperative nausea and vomiting)    URI (upper respiratory infection)    Wears contact lenses     Surgical History: Past Surgical History:  Procedure Laterality Date   CATARACT EXTRACTION W/PHACO Right 07/17/2018   Procedure: CATARACT EXTRACTION PHACO AND INTRAOCULAR LENS PLACEMENT (IOC) RIGHT  PANOPTIC LENS;  Surgeon: Galen Manila, MD;  Location: [facility];  Service: Ophthalmology;  Laterality: Right;   CATARACT EXTRACTION W/PHACO Left 08/14/2018   Procedure: CATARACT EXTRACTION PHACO AND INTRAOCULAR LENS PLACEMENT (IOC)  LEFT PANOPTIX LENS;  Surgeon: Galen Manila, MD;  Location: [facility];  Service: Ophthalmology;  Laterality: Left;   COLONOSCOPY WITH PROPOFOL N/A 12/07/2018   Procedure: COLONOSCOPY WITH BIOPSY;  Surgeon: Midge Minium, MD;  Location: [facility];  Service: Endoscopy;  Laterality: N/A;  Placed clip x2 at Cecal Polyp removal  site    COLONOSCOPY WITH PROPOFOL N/A 06/03/2022   Procedure: COLONOSCOPY WITH PROPOFOL;  Surgeon: Midge Minium, MD;  Location: [facility];  Service: Endoscopy;  Laterality: N/A;   EXTRACORPOREAL SHOCK WAVE LITHOTRIPSY Right 11/07/2019   Procedure: EXTRACORPOREAL SHOCK WAVE LITHOTRIPSY (ESWL);  Surgeon: Riki Altes, MD;  Location: [facility];  Service: Urology;  Laterality: Right;   FACIAL RECONSTRUCTION SURGERY     NASAL SINUS SURGERY     POLYPECTOMY N/A 12/07/2018   Procedure: POLYPECTOMY;  Surgeon: Midge Minium, MD;  Location: [facility];  Service: Endoscopy;  Laterality: N/A;   RETINAL DETACHMENT SURGERY     SHOULDER SURGERY     SHOULDER SURGERY      Home Medications:  Allergies as of 02/10/2023   No Known Allergies      Medication List        Accurate as of February 10, 2023  9:59 AM. If you have any questions, ask your nurse or doctor.          STOP taking these medications    colchicine 0.6 MG tablet Stopped by: Kerby Borner   indomethacin 50 MG capsule Commonly known as: INDOCIN Stopped by: Ravonda Brecheen       TAKE these medications    allopurinol 100 MG tablet Commonly known as: ZYLOPRIM Take 100 mg by mouth daily.   oxyCODONE-acetaminophen 5-325 MG tablet Commonly known as: Percocet Take 1 tablet by mouth every 4 (four) hours as needed for severe pain.        Allergies:  No Known Allergies  Family History: Family History  Problem Relation Age of Onset   Dementia Maternal Grandfather    Colon cancer Maternal Grandfather    Gout Father    Heart failure Paternal Grandfather    Heart disease Mother    Allergies Mother     Social History:  reports that he has never smoked. He has never been exposed to tobacco smoke. He has never used smokeless tobacco. He reports that he does not drink alcohol and does not use drugs.  ROS: Pertinent ROS in HPI  Physical Exam: BP 138/84   Pulse 74   Ht 5\' 10"  (1.778 m)   Wt 205  lb (93 kg)   BMI 29.41 kg/m   Constitutional:  Well nourished. Alert and oriented, No acute distress. HEENT: [place] AT, moist mucus membranes.  Trachea midline Cardiovascular: No clubbing, cyanosis, or edema. Respiratory: Normal respiratory effort, no increased work of breathing. Neurologic: Grossly intact, no focal deficits, moving all 4 extremities. Psychiatric: Normal mood and affect.  Laboratory Data: Urinalysis See EPIC and HPI  I have reviewed the labs.   Pertinent Imaging: KUB bilateral nephrolithiasis  I have independently reviewed the films.    Assessment & Plan:    1. Flank pain -UA clear -KUB bilateral nephrolithiasis   2. Bilateral nephrolithiasis -he had bilateral stones on KUB -I wanted to get a stat CT today, but his insurance would not approve it in a timely manner -He stated he will just take the tamsulosin 0.4 mg daily and follow-up in 2 weeks   Return in 2 weeks (on 02/24/2023) for 2 week follow-up.  These notes generated with voice recognition software. I apologize for typographical errors.  Cloretta Ned  [facility] [address]  [address] [place], [place] [address] [phone]

## 2023-02-09 NOTE — Addendum Note (Signed)
Addended by: Michiel Cowboy A on: 02/09/2023 09:38 AM   Modules accepted: Orders

## 2023-02-10 ENCOUNTER — Telehealth: Payer: Self-pay

## 2023-02-10 ENCOUNTER — Encounter: Payer: Self-pay | Admitting: Urology

## 2023-02-10 ENCOUNTER — Ambulatory Visit: Payer: Managed Care, Other (non HMO) | Admitting: Urology

## 2023-02-10 VITALS — BP 138/84 | HR 74 | Ht 70.0 in | Wt 205.0 lb

## 2023-02-10 DIAGNOSIS — R109 Unspecified abdominal pain: Secondary | ICD-10-CM

## 2023-02-10 DIAGNOSIS — N200 Calculus of kidney: Secondary | ICD-10-CM | POA: Diagnosis not present

## 2023-02-10 DIAGNOSIS — R7989 Other specified abnormal findings of blood chemistry: Secondary | ICD-10-CM

## 2023-02-10 LAB — URINALYSIS, COMPLETE
Bilirubin, UA: NEGATIVE
Glucose, UA: NEGATIVE
Ketones, UA: NEGATIVE
Leukocytes,UA: NEGATIVE
Nitrite, UA: NEGATIVE
Protein,UA: NEGATIVE
RBC, UA: NEGATIVE
Specific Gravity, UA: 1.02 (ref 1.005–1.030)
Urobilinogen, Ur: 0.2 mg/dL (ref 0.2–1.0)
pH, UA: 6.5 (ref 5.0–7.5)

## 2023-02-10 LAB — MICROSCOPIC EXAMINATION: Bacteria, UA: NONE SEEN

## 2023-02-10 NOTE — Telephone Encounter (Signed)
Message left on triage line from [facility] stating that they need a prior auth to get this pt scheduled for his CT Stone study, they can be reached at [phone] ext. [phone]

## 2023-02-13 ENCOUNTER — Ambulatory Visit
Admission: RE | Admit: 2023-02-13 | Discharge: 2023-02-13 | Disposition: A | Discharge status: Home / Self Care | Payer: Managed Care, Other (non HMO) | Source: Ambulatory Visit | Attending: Urology | Admitting: Urology

## 2023-02-13 DIAGNOSIS — R7989 Other specified abnormal findings of blood chemistry: Secondary | ICD-10-CM

## 2023-02-13 DIAGNOSIS — R109 Unspecified abdominal pain: Secondary | ICD-10-CM

## 2023-02-13 LAB — CULTURE, URINE COMPREHENSIVE

## 2023-02-28 ENCOUNTER — Encounter: Payer: Self-pay | Admitting: Urology

## 2023-02-28 ENCOUNTER — Ambulatory Visit: Payer: Managed Care, Other (non HMO) | Admitting: Urology

## 2023-02-28 VITALS — BP 128/79 | HR 88 | Ht 70.0 in | Wt 208.0 lb

## 2023-02-28 DIAGNOSIS — R109 Unspecified abdominal pain: Secondary | ICD-10-CM

## 2023-02-28 DIAGNOSIS — N200 Calculus of kidney: Secondary | ICD-10-CM

## 2023-02-28 MED ORDER — TAMSULOSIN HCL 0.4 MG PO CAPS
0.4000 mg | ORAL_CAPSULE | Freq: Every day | ORAL | 3 refills | Status: AC
Start: 2023-02-28 — End: ?

## 2023-02-28 NOTE — Progress Notes (Signed)
 02/28/2023 4:22 PM   Troy Solomon Oct 03, 1970 [id number]  Referring provider: Jeyaretnam, Vindya, DO [address] [place],  [place] [address]  Urological history: 1.  Nephrolithiasis -right ESWL  -KUB (11/2022) bilateral nephrolithiasis  Chief Complaint  Patient presents with   Follow-up    2 week follow-up   HPI: Troy Solomon is a 53 y.o. male who presents today for follow up.  Previous records reviewed.   At his visit on 02/10/2023, he states over the last week, he has been having left-sided pain and intermittent pain that runs through his penis.  Patient denies any modifying or aggravating factors.  Patient denies any recent UTI's, gross hematuria, dysuria or suprapubic/flank pain.  Patient denies any fevers, chills, nausea or vomiting.   He had blood work recently and his serum creatinine was slightly elevated at 1.31 from 1.17 6 months ago.   KUB bilateral nephrolithiasis.  UA benign .  Follow up CT renal stone study on 02/13/2023 revealed bilateral nonobstructive renal stones with with lumbar disc disease.    He is still having left-sided pain, but he is due for injections for his back tomorrow and hopefully that will relieve his discomfort.    Patient denies any modifying or aggravating factors.  Patient denies any recent UTI's, gross hematuria, dysuria or suprapubic/flank pain.  Patient denies any fevers, chills, nausea or vomiting.    PMH: Past Medical History:  Diagnosis Date   Cough    Gout    Kidney stone    Lumbago    PONV (postoperative nausea and vomiting)    URI (upper respiratory infection)    Wears contact lenses     Surgical History: Past Surgical History:  Procedure Laterality Date   CATARACT EXTRACTION W/PHACO Right 07/17/2018   Procedure: CATARACT EXTRACTION PHACO AND INTRAOCULAR LENS PLACEMENT (IOC) RIGHT  PANOPTIC LENS;  Surgeon: Jaye Fallow, MD;  Location: [facility];  Service: Ophthalmology;  Laterality:  Right;   CATARACT EXTRACTION W/PHACO Left 08/14/2018   Procedure: CATARACT EXTRACTION PHACO AND INTRAOCULAR LENS PLACEMENT (IOC)  LEFT PANOPTIX LENS;  Surgeon: Jaye Fallow, MD;  Location: [facility];  Service: Ophthalmology;  Laterality: Left;   COLONOSCOPY WITH PROPOFOL  N/A 12/07/2018   Procedure: COLONOSCOPY WITH BIOPSY;  Surgeon: Jinny Carmine, MD;  Location: [facility];  Service: Endoscopy;  Laterality: N/A;  Placed clip x2 at Cecal Polyp removal site    COLONOSCOPY WITH PROPOFOL  N/A 06/03/2022   Procedure: COLONOSCOPY WITH PROPOFOL ;  Surgeon: Jinny Carmine, MD;  Location: [facility];  Service: Endoscopy;  Laterality: N/A;   EXTRACORPOREAL SHOCK WAVE LITHOTRIPSY Right 11/07/2019   Procedure: EXTRACORPOREAL SHOCK WAVE LITHOTRIPSY (ESWL);  Surgeon: Twylla Glendia BROCKS, MD;  Location: [facility];  Service: Urology;  Laterality: Right;   FACIAL RECONSTRUCTION SURGERY     NASAL SINUS SURGERY     POLYPECTOMY N/A 12/07/2018   Procedure: POLYPECTOMY;  Surgeon: Jinny Carmine, MD;  Location: [facility];  Service: Endoscopy;  Laterality: N/A;   RETINAL DETACHMENT SURGERY     SHOULDER SURGERY     SHOULDER SURGERY      Home Medications:  Allergies as of 02/28/2023   No Known Allergies      Medication List        Accurate as of February 28, 2023  4:22 PM. If you have any questions, ask your nurse or doctor.          allopurinol 100 MG tablet Commonly known as: ZYLOPRIM Take 100  mg by mouth daily.   oxyCODONE -acetaminophen  5-325 MG tablet Commonly known as: Percocet Take 1 tablet by mouth every 4 (four) hours as needed for severe pain.   tamsulosin  0.4 MG Caps capsule Commonly known as: FLOMAX  Take 1 capsule (0.4 mg total) by mouth daily. Started by: CLOTILDA CORNWALL        Allergies: No Known Allergies  Family History: Family History  Problem Relation Age of Onset   Dementia Maternal Grandfather    Colon cancer Maternal Grandfather    Gout  Father    Heart failure Paternal Grandfather    Heart disease Mother    Allergies Mother     Social History:  reports that he has never smoked. He has never been exposed to tobacco smoke. He has never used smokeless tobacco. He reports that he does not drink alcohol and does not use drugs.  ROS: Pertinent ROS in HPI  Physical Exam: BP 128/79   Pulse 88   Ht 5' 10 (1.778 m)   Wt 208 lb (94.3 kg)   BMI 29.84 kg/m   Constitutional:  Well nourished. Alert and oriented, No acute distress. HEENT: [place] AT, moist mucus membranes.  Trachea midline Cardiovascular: No clubbing, cyanosis, or edema. Respiratory: Normal respiratory effort, no increased work of breathing. Neurologic: Grossly intact, no focal deficits, moving all 4 extremities. Psychiatric: Normal mood and affect.   Laboratory Data: N/A  Pertinent Imaging: Narrative & Impression  CLINICAL DATA:  Left-sided abdominal pain/flank pain intermittent.   EXAM: CT ABDOMEN AND PELVIS WITHOUT CONTRAST   TECHNIQUE: Multidetector CT imaging of the abdomen and pelvis was performed following the standard protocol without IV contrast.   RADIATION DOSE REDUCTION: This exam was performed according to the departmental dose-optimization program which includes automated exposure control, adjustment of the mA and/or kV according to patient size and/or use of iterative reconstruction technique.   COMPARISON:  CT October 28, 2019   FINDINGS: Lower chest: No acute abnormality.   Hepatobiliary: Unremarkable noncontrast enhanced appearance of the hepatic parenchyma. Gallbladder is unremarkable. No biliary ductal dilation.   Pancreas: Periampullary duodenal diverticulum. No pancreatic ductal dilation or evidence of acute inflammation   Spleen: No splenomegaly.   Adrenals/Urinary Tract: Bilateral adrenal glands appear normal. Bilateral nonobstructive renal stones measure up to 7 mm on the left and 4-5 mm on the right. No obstructive  ureteral or bladder calculi. Urinary bladder is minimally distended limiting evaluation.   Stomach/Bowel: No radiopaque enteric contrast material was administered. Stomach is unremarkable for degree of distension. No pathologic dilation of small or large bowel. Small to moderate volume of formed stool in the colon. No evidence of acute bowel inflammation.   Vascular/Lymphatic: Scattered aortic atherosclerosis. Normal caliber abdominal aorta. Smooth IVC contours. No pathologically enlarged abdominal or pelvic lymph nodes.   Reproductive: Prostate is unremarkable.   Other: No significant abdominopelvic free fluid.   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 L5-S1 discogenic disease.   IMPRESSION: 1. Bilateral nonobstructive renal stones measure up to 7 mm on the left and 4-5 mm on the right. No obstructive ureteral or bladder calculi. 2.  Aortic Atherosclerosis (ICD10-I70.0).     Electronically Signed   By: Reyes Holder M.D.   On: 02/13/2023 15:43    I have independently reviewed the films.  See HPI.    Assessment & Plan:    1. Flank pain -UA clear -KUB bilateral nephrolithiasis  -CT renal stone study did not demonstrate any obstructive stones -Likely muscle skeletal in nature -  He is having injections in his back tomorrow so hopefully this pain will improve  2. Bilateral nephrolithiasis -he had bilateral stones on KUB -He would like to continue the tamsulosin  0.4 mg daily for now, he does not desire definitive treatment at this time  Return if symptoms worsen or fail to improve.  These notes generated with voice recognition software. I apologize for typographical errors.  CLOTILDA HELON RIGGERS  [facility] [address]  [address] [place], [place] [address] [phone]

## 2024-03-16 ENCOUNTER — Ambulatory Visit
Admission: EM | Admit: 2024-03-16 | Discharge: 2024-03-16 | Disposition: A | Discharge status: Home / Self Care | Attending: Family Medicine | Admitting: Family Medicine

## 2024-03-16 DIAGNOSIS — R112 Nausea with vomiting, unspecified: Secondary | ICD-10-CM | POA: Diagnosis not present

## 2024-03-16 DIAGNOSIS — R509 Fever, unspecified: Secondary | ICD-10-CM | POA: Diagnosis not present

## 2024-03-16 DIAGNOSIS — R10A0 Flank pain, unspecified side: Secondary | ICD-10-CM

## 2024-03-16 LAB — POCT URINE DIPSTICK
Glucose, UA: NEGATIVE mg/dL
Leukocytes, UA: NEGATIVE
Nitrite, UA: NEGATIVE
Protein Ur, POC: 30 mg/dL — AB
Spec Grav, UA: 1.025
Urobilinogen, UA: 0.2 U/dL
pH, UA: 5.5

## 2024-03-16 MED ORDER — ONDANSETRON 4 MG PO TBDP
4.0000 mg | ORAL_TABLET | Freq: Once | ORAL | Status: AC
  Administered 2024-03-16: 4 mg via ORAL

## 2024-03-16 NOTE — ED Notes (Signed)
 Patient is being discharged from the Urgent Care and sent to the Emergency Department via private vehicle . Per Myla Bold, NP, patient is in need of higher level of care due to Possible Kidney Stone. Patient is aware and verbalizes understanding of plan of care.  Vitals:   03/16/24 1350  BP: 116/76  Pulse: (!) 112  Resp: 18  Temp: 100.3 F (37.9 C)  SpO2: 96%

## 2024-03-16 NOTE — ED Triage Notes (Signed)
 Pt c/o R side pain x1 day & N/V since this AM. Had 1 episode of emesis PTA. Has tried left over Flomax  & percocet w/o relief. Hx of kidney stones. Denies hematuria.

## 2024-03-16 NOTE — ED Provider Notes (Signed)
 " [facility]    CSN: [id number] Arrival date & time: 03/16/24  1301      History   Chief Complaint Chief Complaint  Patient presents with   Flank Pain   Nausea   Emesis    HPI Troy Solomon is a 54 y.o. male presents for abdominal pain.  Patient reports yesterday he developed a right lateral slightly anterior mid abdominal pain that has been persistent.  He states the pain has been so bad that he has vomited.  He denies any fevers at home, diarrhea, URI symptoms, sore throat.  States he has a history of kidney stones on the right and this feels similar.  He had leftover Flomax  and 1 Percocet from his previous stone that he took yesterday with minimal improvement.  He denies hematuria or difficulty starting or stopping the urine stream.  No other concerns   Flank Pain Associated symptoms include abdominal pain.  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   Chronic sinusitis 11/10/2021   Retinal detachment, rhegmatogenous, 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   Hx of gout 07/09/2020   Acute idiopathic gout involving toe of left foot 11/05/2019   Kidney stone on right side 10/28/2019   Obesity, 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   Primary insomnia 09/02/2019   Achilles tendinitis 04/25/2019   Calcaneal spur 04/25/2019   Polyp of ascending colon    Encounter for well adult exam without abnormal findings 08/31/2018   Anxiety and depression 05/03/2018   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 05/03/2018    Status post rotator cuff repair 05/03/2018   Sensorineural hearing loss (SNHL), bilateral 07/26/2017   Tinnitus aurium, 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;  Service: Ophthalmology;  Laterality: Left;   COLONOSCOPY WITH PROPOFOL  N/A 12/07/2018   Procedure: COLONOSCOPY WITH BIOPSY;  Surgeon: Jinny Carmine, MD;  Location: [facility];  Service: Endoscopy;  Laterality: N/A;  Placed clip x2 at Cecal Polyp removal site    COLONOSCOPY WITH PROPOFOL  N/A 06/03/2022   Procedure: COLONOSCOPY WITH PROPOFOL ;  Surgeon: Jinny Carmine, MD;  Location: [facility];  Service: Endoscopy;  Laterality: N/A;   EXTRACORPOREAL SHOCK WAVE LITHOTRIPSY Right 11/07/2019   Procedure: EXTRACORPOREAL SHOCK WAVE LITHOTRIPSY (ESWL);  Surgeon: Twylla Glendia BROCKS, MD;  Location: [facility];  Service: Urology;  Laterality: Right;   FACIAL RECONSTRUCTION SURGERY     NASAL SINUS SURGERY     POLYPECTOMY N/A 12/07/2018   Procedure: POLYPECTOMY;  Surgeon: Jinny Carmine, MD;  Location: [facility];  Service: Endoscopy;  Laterality: N/A;   RETINAL DETACHMENT SURGERY     SHOULDER SURGERY     SHOULDER SURGERY         Home Medications    Prior to Admission medications  Medication Sig Start Date End Date Taking? Authorizing Provider  allopurinol (ZYLOPRIM) 100 MG tablet Take 100 mg by mouth daily.    [provider]  colchicine 0.6 MG tablet SMARTSIG:By Mouth    [provider]   oxyCODONE -acetaminophen  (PERCOCET) 5-325 MG tablet Take 1 tablet by mouth every 4 (four) hours as needed for severe pain. 10/17/22   Helon Kirsch A, PA-C  tamsulosin  (FLOMAX ) 0.4 MG CAPS capsule Take 1 capsule (0.4 mg total) by mouth daily. 02/28/23   Helon Kirsch LABOR, PA-C    Family History Family History  Problem Relation Age of Onset   Dementia Maternal Grandfather    Colon cancer Maternal Grandfather    Gout Father    Heart failure Paternal Grandfather    Heart disease Mother    Allergies Mother     Social History Social History[1]   Allergies   Patient has no known allergies.   Review of Systems Review of Systems  Gastrointestinal:  Positive for abdominal pain and vomiting.  Genitourinary:  Positive for flank pain.     Physical Exam Triage Vital Signs ED Triage Vitals  Encounter Vitals Group     BP 03/16/24 1350 116/76     Girls Systolic BP Percentile --      Girls Diastolic BP Percentile --      Boys Systolic BP Percentile --      Boys Diastolic BP Percentile --      Pulse Rate 03/16/24 1350 (!) 112     Resp 03/16/24 1350 18     Temp 03/16/24 1350 100.3 F (37.9 C)     Temp Source 03/16/24 1350 Oral     SpO2 03/16/24 1350 96 %     Weight 03/16/24 1348 205 lb (93 kg)     Height --      Head Circumference --      Peak Flow --      Pain Score 03/16/24 1350 9     Pain Loc --      Pain Education --      Exclude from Growth Chart --    No data found.  Updated Vital Signs BP 116/76 (BP Location: Right Arm)   Pulse (!) 112   Temp 100.3 F (37.9 C) (Oral)   Resp 18   Wt 205 lb (93 kg)   SpO2 96%   BMI 29.41 kg/m   Visual Acuity Right Eye Distance:   Left Eye Distance:   Bilateral Distance:    Right Eye Near:   Left Eye Near:    Bilateral Near:     Physical Exam Vitals and nursing note reviewed.  Constitutional:      General: He is not in acute distress.    Appearance: Normal appearance. He is not ill-appearing.  HENT:     Head:  Normocephalic and atraumatic.  Eyes:     Pupils: Pupils are equal, round, and reactive to light.  Cardiovascular:     Rate and Rhythm: Tachycardia present.     Comments: HR 112 in setting of fever Pulmonary:     Effort: Pulmonary effort is normal.  Abdominal:     Tenderness: There is no right CVA tenderness, left CVA tenderness, guarding or rebound. Negative signs  include Rovsing's sign and McBurney's sign.      Comments: Pt points to mid right abd as site of pain, mild discomfort with palpation   Skin:    General: Skin is warm and dry.  Neurological:     General: No focal deficit present.     Mental Status: He is alert and oriented to person, place, and time.      UC Treatments / Results  Labs (all labs ordered are listed, but only abnormal results are displayed) Labs Reviewed  POCT URINE DIPSTICK - Abnormal; Notable for the following components:      Result Value   Color, UA orange (*)    Clarity, UA cloudy (*)    Bilirubin, UA moderate (*)    Ketones, POC UA large (80) (*)    Blood, UA large (*)    Protein Ur, POC =30 (*)    All other components within normal limits    EKG   Radiology No results found.  Procedures Procedures (including critical care time)  Medications Ordered in UC Medications  ondansetron  (ZOFRAN -ODT) disintegrating tablet 4 mg (4 mg Oral Given 03/16/24 1414)    Initial Impression / Assessment and Plan / UC Course  I have reviewed the triage vital signs and the nursing notes.  Pertinent labs & imaging results that were available during my care of the patient were reviewed by me and considered in my medical decision making (see chart for details).     Reviewed exam and symptoms with patient.  He was given Zofran  in clinic for nausea.  UA does show large amount of blood but no leukocytes or nitrates.  Given his fever without signs of UTI and symptoms advised to go to the emergency room to rule out obstruction.  He is in agreement plan will go  POV with his wife to [facility] emergency room Final Clinical Impressions(s) / UC Diagnoses   Final diagnoses:  Flank pain, unspecified laterality  Nausea and vomiting, unspecified vomiting type  Fever, unspecified     Discharge Instructions      Please go to the ER for further evaluation of your fever with possible kidney stone/obstruction      ED Prescriptions   None    PDMP not reviewed this encounter.     [1]  Social History Tobacco Use   Smoking status: Never    Passive exposure: Never   Smokeless tobacco: Never  Vaping Use   Vaping status: Never Used  Substance Use Topics   Alcohol use: No   Drug use: No     Loreda Myla SAUNDERS, NP 03/16/24 1433  "

## 2024-03-16 NOTE — Discharge Instructions (Addendum)
 Please go to the ER for further evaluation of your fever with possible kidney stone/obstruction

## 2024-03-20 ENCOUNTER — Ambulatory Visit
Admission: RE | Admit: 2024-03-20 | Discharge: 2024-03-20 | Disposition: A | Discharge status: Home / Self Care | Source: Ambulatory Visit | Attending: Urology | Admitting: Urology

## 2024-03-20 ENCOUNTER — Encounter: Payer: Self-pay | Admitting: Urology

## 2024-03-20 ENCOUNTER — Ambulatory Visit
Admission: RE | Admit: 2024-03-20 | Discharge: 2024-03-20 | Disposition: A | Source: Ambulatory Visit | Attending: Urology | Admitting: Urology

## 2024-03-20 ENCOUNTER — Other Ambulatory Visit: Payer: Self-pay

## 2024-03-20 ENCOUNTER — Ambulatory Visit: Admitting: Urology

## 2024-03-20 VITALS — BP 140/87 | HR 106 | Ht 70.0 in | Wt 198.0 lb

## 2024-03-20 DIAGNOSIS — N1330 Unspecified hydronephrosis: Secondary | ICD-10-CM

## 2024-03-20 DIAGNOSIS — N201 Calculus of ureter: Secondary | ICD-10-CM

## 2024-03-20 DIAGNOSIS — N200 Calculus of kidney: Secondary | ICD-10-CM | POA: Insufficient documentation

## 2024-03-20 DIAGNOSIS — N23 Unspecified renal colic: Secondary | ICD-10-CM

## 2024-03-20 NOTE — Progress Notes (Signed)
"  ° ° °  [facility] ESWL POSTING SHEET        Patient Name: Troy Solomon  DOB: 1970-09-22  MRN: [id number]  Surgeon:  Glendia Barba, MD  Diagnosis:  Right Ureteral Stone  CPT: [phone]  ESWL DATE: 03/28/2024  ESWL TIME: 100pm   Special Needs/Requirements: None       Cardiac/Medical/Pulmonary Clearance needed: no       Form Faxed to [facility]- [phone] Date:   Date: 03/20/24       Form Faxed to [facility]- [phone]  Date:  Date: 03/20/24           Copy Made for Insurance PA:  Date: 03/20/24       Orders Entered in to Epic:  Date: 03/20/24                   "

## 2024-03-20 NOTE — Progress Notes (Unsigned)
 "  03/20/2024 9:20 AM   Troy Solomon 09/09/1970 [id number]  Referring provider: Arnetta Manson, DO [address] [place],  [place] [address]  Chief Complaint  Patient presents with   Other    HPI: Troy Solomon is a 54 y.o. male presents in follow-up of recent ED visit for renal colic.  Seen [facility] 03/16/2024 with a 24-hour history of right abdominal pain similar to prior episode renal colic.  Was noted to have temp 100.3 in ED evaluation was recommended Was seen at [facility] ED later that afternoon.  He had mild leukocytosis and a creatinine of 1.93.  Pain was improved with parenteral analgesics.  He was offered stent placement however declined and elected local urologic follow-up.  He was discharged on oxycodone  and tamsulosin  Since ED visit has had persistent pain controlled with oxycodone .  No recurrent fever.  His urine culture was negative.   PMH: Past Medical History:  Diagnosis Date   Cough    Gout    Kidney stone    Lumbago    PONV (postoperative nausea and vomiting)    URI (upper respiratory infection)    Wears contact lenses     Surgical History: Past Surgical History:  Procedure Laterality Date   CATARACT EXTRACTION W/PHACO Right 07/17/2018   Procedure: CATARACT EXTRACTION PHACO AND INTRAOCULAR LENS PLACEMENT (IOC) RIGHT  PANOPTIC LENS;  Surgeon: Jaye Fallow, MD;  Location: [facility];  Service: Ophthalmology;  Laterality: Right;   CATARACT EXTRACTION W/PHACO Left 08/14/2018   Procedure: CATARACT EXTRACTION PHACO AND INTRAOCULAR LENS PLACEMENT (IOC)  LEFT PANOPTIX LENS;  Surgeon: Jaye Fallow, MD;  Location: [facility];  Service: Ophthalmology;  Laterality: Left;   COLONOSCOPY WITH PROPOFOL  N/A 12/07/2018   Procedure: COLONOSCOPY WITH BIOPSY;  Surgeon: Jinny Carmine, MD;  Location: [facility];  Service: Endoscopy;  Laterality: N/A;  Placed clip x2 at Cecal Polyp removal site    COLONOSCOPY  WITH PROPOFOL  N/A 06/03/2022   Procedure: COLONOSCOPY WITH PROPOFOL ;  Surgeon: Jinny Carmine, MD;  Location: [facility];  Service: Endoscopy;  Laterality: N/A;   EXTRACORPOREAL SHOCK WAVE LITHOTRIPSY Right 11/07/2019   Procedure: EXTRACORPOREAL SHOCK WAVE LITHOTRIPSY (ESWL);  Surgeon: Twylla Glendia BROCKS, MD;  Location: [facility];  Service: Urology;  Laterality: Right;   FACIAL RECONSTRUCTION SURGERY     NASAL SINUS SURGERY     POLYPECTOMY N/A 12/07/2018   Procedure: POLYPECTOMY;  Surgeon: Jinny Carmine, MD;  Location: [facility];  Service: Endoscopy;  Laterality: N/A;   RETINAL DETACHMENT SURGERY     SHOULDER SURGERY     SHOULDER SURGERY      Home Medications:  Allergies as of 03/20/2024   No Known Allergies      Medication List        Accurate as of March 20, 2024  9:20 AM. If you have any questions, ask your nurse or doctor.          STOP taking these medications    oxyCODONE -acetaminophen  5-325 MG tablet Commonly known as: Percocet Stopped by: Glendia Twylla, MD       TAKE these medications    allopurinol 100 MG tablet Commonly known as: ZYLOPRIM Take 100 mg by mouth daily.   colchicine 0.6 MG tablet SMARTSIG:By Mouth   ondansetron  4 MG disintegrating tablet Commonly known as: ZOFRAN -ODT Take by mouth.   oxyCODONE  5 MG immediate release tablet Commonly known as: Oxy IR/ROXICODONE  Take 5 mg by mouth 2 (two) times daily as needed.  tamsulosin  0.4 MG Caps capsule Commonly known as: FLOMAX  Take 1 capsule (0.4 mg total) by mouth daily.   Zepbound 5 MG/0.5ML Pen Generic drug: tirzepatide SMARTSIG:5 Milligram(s) SUB-Q Once a Week        Allergies: Allergies[1]  Family History: Family History  Problem Relation Age of Onset   Dementia Maternal Grandfather    Colon cancer Maternal Grandfather    Gout Father    Heart failure Paternal Grandfather    Heart disease Mother    Allergies Mother     Social History:  reports that he has  never smoked. He has never been exposed to tobacco smoke. He has never used smokeless tobacco. He reports that he does not drink alcohol and does not use drugs.   Physical Exam: BP (!) 140/87   Pulse (!) 106   Ht 5' 10 (1.778 m)   Wt 198 lb (89.8 kg)   BMI 28.41 kg/m   Constitutional:  Alert, No acute distress. HEENT: [place] AT Respiratory: Normal respiratory effort, no increased work of breathing. Psychiatric: Normal mood and affect.   Pertinent Imaging: CT images were personally reviewed and interpreted on [facility].  A KUB was obtained today which was reviewed and the calculus is visualized overlying the right sacrum    Assessment & Plan:    1.  Right ureteral calculus We discussed various treatment options for urolithiasis including observation with or without medical expulsive therapy, shockwave lithotripsy (SWL), ureteroscopy and laser lithotripsy with stent placement. We discussed that management is based on stone size, location, density, patient co-morbidities, and patient preference.  Stones <33mm in size have a >80% spontaneous passage rate. Data surrounding the use of tamsulosin  for medical expulsive therapy is controversial, but meta analyses suggests it is most efficacious for distal stones between 5-78mm in size. Possible side effects include dizziness/lightheadedness, and retrograde ejaculation. SWL has a lower stone free rate in a single procedure, but also a lower complication rate compared to ureteroscopy and avoids a stent and associated stent related symptoms. Possible complications include renal hematoma, steinstrasse, and need for additional treatment. Ureteroscopy with laser lithotripsy and stent placement has a higher stone free rate than SWL in a single procedure, however increased complication rate including possible infection, ureteral injury, bleeding, and stent related morbidity. Common stent related symptoms include dysuria, urgency/frequency, and flank  pain. After an extensive discussion of the risks and benefits of the above treatment options, the patient would like to proceed with SWL. Was unable to schedule him for tomorrow's treatment date as he is on a GLP-1 and has not been off x 1 week  2.  Bilateral nephrolithiasis Will discuss metabolic evaluation on follow-up   [name], MD  [facility] [address] [place], [place] [address] [phone]     [1] No Known Allergies  "

## 2024-03-20 NOTE — H&P (View-Only) (Signed)
 "  03/20/2024 9:20 AM   [name] 09/09/1970 [id number]  Referring provider: Arnetta Manson, DO [address] [place],  [place] [address]  Chief Complaint  Patient presents with   Other    HPI: Troy Solomon is a 54 y.o. male presents in follow-up of recent ED visit for renal colic.  Seen [facility] 03/16/2024 with a 24-hour history of right abdominal pain similar to prior episode renal colic.  Was noted to have temp 100.3 in ED evaluation was recommended Was seen at [facility] ED later that afternoon.  He had mild leukocytosis and a creatinine of 1.93.  Pain was improved with parenteral analgesics.  He was offered stent placement however declined and elected local urologic follow-up.  He was discharged on oxycodone  and tamsulosin  Since ED visit has had persistent pain controlled with oxycodone .  No recurrent fever.  His urine culture was negative.   PMH: Past Medical History:  Diagnosis Date   Cough    Gout    Kidney stone    Lumbago    PONV (postoperative nausea and vomiting)    URI (upper respiratory infection)    Wears contact lenses     Surgical History: Past Surgical History:  Procedure Laterality Date   CATARACT EXTRACTION W/PHACO Right 07/17/2018   Procedure: CATARACT EXTRACTION PHACO AND INTRAOCULAR LENS PLACEMENT (IOC) RIGHT  PANOPTIC LENS;  Surgeon: Jaye Fallow, MD;  Location: [facility];  Service: Ophthalmology;  Laterality: Right;   CATARACT EXTRACTION W/PHACO Left 08/14/2018   Procedure: CATARACT EXTRACTION PHACO AND INTRAOCULAR LENS PLACEMENT (IOC)  LEFT PANOPTIX LENS;  Surgeon: Jaye Fallow, MD;  Location: [facility];  Service: Ophthalmology;  Laterality: Left;   COLONOSCOPY WITH PROPOFOL  N/A 12/07/2018   Procedure: COLONOSCOPY WITH BIOPSY;  Surgeon: Jinny Carmine, MD;  Location: [facility];  Service: Endoscopy;  Laterality: N/A;  Placed clip x2 at Cecal Polyp removal site    COLONOSCOPY  WITH PROPOFOL  N/A 06/03/2022   Procedure: COLONOSCOPY WITH PROPOFOL ;  Surgeon: Jinny Carmine, MD;  Location: [facility];  Service: Endoscopy;  Laterality: N/A;   EXTRACORPOREAL SHOCK WAVE LITHOTRIPSY Right 11/07/2019   Procedure: EXTRACORPOREAL SHOCK WAVE LITHOTRIPSY (ESWL);  Surgeon: Twylla Glendia BROCKS, MD;  Location: [facility];  Service: Urology;  Laterality: Right;   FACIAL RECONSTRUCTION SURGERY     NASAL SINUS SURGERY     POLYPECTOMY N/A 12/07/2018   Procedure: POLYPECTOMY;  Surgeon: Jinny Carmine, MD;  Location: [facility];  Service: Endoscopy;  Laterality: N/A;   RETINAL DETACHMENT SURGERY     SHOULDER SURGERY     SHOULDER SURGERY      Home Medications:  Allergies as of 03/20/2024   No Known Allergies      Medication List        Accurate as of March 20, 2024  9:20 AM. If you have any questions, ask your nurse or doctor.          STOP taking these medications    oxyCODONE -acetaminophen  5-325 MG tablet Commonly known as: Percocet Stopped by: Glendia Twylla, MD       TAKE these medications    allopurinol 100 MG tablet Commonly known as: ZYLOPRIM Take 100 mg by mouth daily.   colchicine 0.6 MG tablet SMARTSIG:By Mouth   ondansetron  4 MG disintegrating tablet Commonly known as: ZOFRAN -ODT Take by mouth.   oxyCODONE  5 MG immediate release tablet Commonly known as: Oxy IR/ROXICODONE  Take 5 mg by mouth 2 (two) times daily as needed.  tamsulosin  0.4 MG Caps capsule Commonly known as: FLOMAX  Take 1 capsule (0.4 mg total) by mouth daily.   Zepbound 5 MG/0.5ML Pen Generic drug: tirzepatide SMARTSIG:5 Milligram(s) SUB-Q Once a Week        Allergies: Allergies[1]  Family History: Family History  Problem Relation Age of Onset   Dementia Maternal Grandfather    Colon cancer Maternal Grandfather    Gout Father    Heart failure Paternal Grandfather    Heart disease Mother    Allergies Mother     Social History:  reports that he has  never smoked. He has never been exposed to tobacco smoke. He has never used smokeless tobacco. He reports that he does not drink alcohol and does not use drugs.   Physical Exam: BP (!) 140/87   Pulse (!) 106   Ht 5' 10 (1.778 m)   Wt 198 lb (89.8 kg)   BMI 28.41 kg/m   Constitutional:  Alert, No acute distress. HEENT: [place] AT Respiratory: Normal respiratory effort, no increased work of breathing. Psychiatric: Normal mood and affect.   Pertinent Imaging: CT images were personally reviewed and interpreted on [facility].  A KUB was obtained today which was reviewed and the calculus is visualized overlying the right sacrum    Assessment & Plan:    1.  Right ureteral calculus We discussed various treatment options for urolithiasis including observation with or without medical expulsive therapy, shockwave lithotripsy (SWL), ureteroscopy and laser lithotripsy with stent placement. We discussed that management is based on stone size, location, density, patient co-morbidities, and patient preference.  Stones <33mm in size have a >80% spontaneous passage rate. Data surrounding the use of tamsulosin  for medical expulsive therapy is controversial, but meta analyses suggests it is most efficacious for distal stones between 5-78mm in size. Possible side effects include dizziness/lightheadedness, and retrograde ejaculation. SWL has a lower stone free rate in a single procedure, but also a lower complication rate compared to ureteroscopy and avoids a stent and associated stent related symptoms. Possible complications include renal hematoma, steinstrasse, and need for additional treatment. Ureteroscopy with laser lithotripsy and stent placement has a higher stone free rate than SWL in a single procedure, however increased complication rate including possible infection, ureteral injury, bleeding, and stent related morbidity. Common stent related symptoms include dysuria, urgency/frequency, and flank  pain. After an extensive discussion of the risks and benefits of the above treatment options, the patient would like to proceed with SWL. Was unable to schedule him for tomorrow's treatment date as he is on a GLP-1 and has not been off x 1 week  2.  Bilateral nephrolithiasis Will discuss metabolic evaluation on follow-up   [name], MD  [facility] [address] [place], [place] [address] [phone]     [1] No Known Allergies  "

## 2024-03-20 NOTE — Progress Notes (Signed)
 ESWL ORDER FORM  Expected date of procedure: 03/21/2024  Surgeon: Redell Burnet, MD  Post op standing: 2-4wk follow up w/KUB prior  Anticoagulation/Aspirin/NSAID standing order: Hold all 72 hours prior  Anesthesia standing order: MAC  VTE standing: SCD's  Dx: Right Ureteral Stone  Procedure: right Extracorporeal shock wave lithotripsy  CPT : 50590  Standing Order Set:   *NPO after mn, KUB  *NS 1106ml/hr, Keflex 500mg  PO, Benadryl  25mg  PO, Valium  10mg  PO, Zofran  4mg  IV  Medications if other than standing orders:   NONE

## 2024-03-23 ENCOUNTER — Ambulatory Visit: Payer: Self-pay | Admitting: Urology

## 2024-03-28 ENCOUNTER — Other Ambulatory Visit: Payer: Self-pay

## 2024-03-28 ENCOUNTER — Encounter: Payer: Self-pay | Admitting: Urology

## 2024-03-28 ENCOUNTER — Ambulatory Visit

## 2024-03-28 ENCOUNTER — Encounter
Admission: RE | Disposition: A | Discharge status: Home / Self Care | Payer: Self-pay | Source: Home / Self Care | Attending: Urology

## 2024-03-28 ENCOUNTER — Ambulatory Visit
Admission: RE | Admit: 2024-03-28 | Discharge: 2024-03-28 | Disposition: A | Discharge status: Home / Self Care | Source: Home / Self Care | Attending: Urology | Admitting: Urology

## 2024-03-28 DIAGNOSIS — N201 Calculus of ureter: Secondary | ICD-10-CM

## 2024-03-28 MED ORDER — DIPHENHYDRAMINE HCL 25 MG PO CAPS
25.0000 mg | ORAL_CAPSULE | ORAL | Status: AC
  Administered 2024-03-28: 25 mg via ORAL

## 2024-03-28 MED ORDER — ONDANSETRON HCL 4 MG/2ML IJ SOLN
4.0000 mg | Freq: Once | INTRAMUSCULAR | Status: AC
  Administered 2024-03-28: 4 mg via INTRAVENOUS

## 2024-03-28 MED ORDER — DIAZEPAM 5 MG PO TABS
ORAL_TABLET | ORAL | Status: AC
  Filled 2024-03-28: qty 2

## 2024-03-28 MED ORDER — ONDANSETRON HCL 4 MG/2ML IJ SOLN
INTRAMUSCULAR | Status: AC
  Filled 2024-03-28: qty 2

## 2024-03-28 MED ORDER — SODIUM CHLORIDE 0.9 % IV SOLN: INTRAVENOUS | Status: DC

## 2024-03-28 MED ORDER — OXYCODONE HCL 5 MG PO TABS: 5.0000 mg | ORAL_TABLET | Freq: Four times a day (QID) | ORAL | 0 refills | Status: AC | PRN

## 2024-03-28 MED ORDER — DIPHENHYDRAMINE HCL 25 MG PO CAPS
ORAL_CAPSULE | ORAL | Status: AC
  Filled 2024-03-28: qty 1

## 2024-03-28 MED ORDER — CEPHALEXIN 500 MG PO CAPS
ORAL_CAPSULE | ORAL | Status: AC
  Filled 2024-03-28: qty 1

## 2024-03-28 MED ORDER — DIAZEPAM 5 MG PO TABS
10.0000 mg | ORAL_TABLET | ORAL | Status: AC
  Administered 2024-03-28: 10 mg via ORAL

## 2024-03-28 MED ORDER — DIAZEPAM 5 MG PO TABS
ORAL_TABLET | ORAL | Status: AC
  Filled 2024-03-28: qty 1

## 2024-03-28 MED ORDER — CEPHALEXIN 500 MG PO CAPS
500.0000 mg | ORAL_CAPSULE | Freq: Once | ORAL | Status: AC
  Administered 2024-03-28: 500 mg via ORAL

## 2024-03-28 NOTE — Discharge Instructions (Addendum)
" ° ° °  As per the [facility] discharge instructions A prescription for pain was sent to your pharmacy Continue tamsulosin  which will help you pass stone fragments was also sent to your pharmacy Call [facility] at [phone] for pain not controlled with oral medications or fever greater than 101 degrees You will be contacted for a follow-up appointment   "

## 2024-03-28 NOTE — Interval H&P Note (Signed)
 History and Physical Interval Note:  03/28/2024 3:47 PM  Troy Solomon  has presented today for surgery, with the diagnosis of Right Ureteral Stone.  The various methods of treatment have been discussed with the patient and family. After consideration of risks, benefits and other options for treatment, the patient has consented to  Procedures: LITHOTRIPSY, ESWL (Right) as a surgical intervention.  The patient's history has been reviewed, patient examined, no change in status, stable for surgery.  I have reviewed the patient's chart and labs.  Questions were answered to the patient's satisfaction.    CV:RR Lungs: clear  [name]

## 2024-03-29 ENCOUNTER — Other Ambulatory Visit: Payer: Self-pay

## 2024-03-29 ENCOUNTER — Encounter: Payer: Self-pay | Admitting: Urology

## 2024-03-29 DIAGNOSIS — N201 Calculus of ureter: Secondary | ICD-10-CM

## 2024-04-16 ENCOUNTER — Ambulatory Visit: Admitting: Physician Assistant
